# Patient Record
Sex: Female | Born: 1950
Health system: Southern US, Community
[De-identification: ages and names within clinical notes are randomized; demographics above are authoritative.]

## PROBLEM LIST (undated history)

## (undated) DIAGNOSIS — I671 Cerebral aneurysm, nonruptured: Secondary | ICD-10-CM

## (undated) HISTORY — PX: CEREBRAL ANEURYSM REPAIR: SHX164

---

## 1998-04-16 ENCOUNTER — Other Ambulatory Visit: Admission: RE | Admit: 1998-04-16 | Discharge: 1998-04-16 | Payer: Self-pay

## 1998-11-05 ENCOUNTER — Other Ambulatory Visit: Admission: RE | Admit: 1998-11-05 | Discharge: 1998-11-05 | Payer: Self-pay | Admitting: Obstetrics & Gynecology

## 1999-10-05 ENCOUNTER — Encounter: Admission: RE | Admit: 1999-10-05 | Discharge: 1999-10-05 | Payer: Self-pay | Admitting: Obstetrics & Gynecology

## 1999-10-06 ENCOUNTER — Encounter: Payer: Self-pay | Admitting: Obstetrics & Gynecology

## 2000-01-07 ENCOUNTER — Other Ambulatory Visit: Admission: RE | Admit: 2000-01-07 | Discharge: 2000-01-07 | Payer: Self-pay | Admitting: Obstetrics and Gynecology

## 2000-12-26 ENCOUNTER — Encounter: Payer: Self-pay | Admitting: Obstetrics and Gynecology

## 2000-12-26 ENCOUNTER — Encounter: Admission: RE | Admit: 2000-12-26 | Discharge: 2000-12-26 | Payer: Self-pay | Admitting: Obstetrics and Gynecology

## 2001-01-05 ENCOUNTER — Other Ambulatory Visit: Admission: RE | Admit: 2001-01-05 | Discharge: 2001-01-05 | Payer: Self-pay | Admitting: Obstetrics and Gynecology

## 2002-01-16 ENCOUNTER — Other Ambulatory Visit: Admission: RE | Admit: 2002-01-16 | Discharge: 2002-01-16 | Payer: Self-pay | Admitting: Obstetrics and Gynecology

## 2002-10-29 ENCOUNTER — Encounter: Admission: RE | Admit: 2002-10-29 | Discharge: 2002-10-29 | Payer: Self-pay | Admitting: Obstetrics and Gynecology

## 2002-10-29 ENCOUNTER — Encounter: Payer: Self-pay | Admitting: Obstetrics and Gynecology

## 2004-06-09 ENCOUNTER — Other Ambulatory Visit: Admission: RE | Admit: 2004-06-09 | Discharge: 2004-06-09 | Payer: Self-pay | Admitting: Obstetrics and Gynecology

## 2008-11-06 ENCOUNTER — Encounter: Admission: RE | Admit: 2008-11-06 | Discharge: 2008-11-06 | Payer: Self-pay | Admitting: Obstetrics and Gynecology

## 2009-07-19 HISTORY — PX: FOOT SURGERY: SHX648

## 2010-01-01 ENCOUNTER — Encounter: Admission: RE | Admit: 2010-01-01 | Discharge: 2010-01-01 | Payer: Self-pay | Admitting: Obstetrics and Gynecology

## 2010-01-07 ENCOUNTER — Encounter: Admission: RE | Admit: 2010-01-07 | Discharge: 2010-01-07 | Payer: Self-pay | Admitting: Obstetrics and Gynecology

## 2010-06-23 ENCOUNTER — Encounter: Admission: RE | Admit: 2010-06-23 | Discharge: 2010-06-23 | Payer: Self-pay | Admitting: Obstetrics and Gynecology

## 2010-07-23 LAB — BASIC METABOLIC PANEL
BUN: 13 mg/dL (ref 6–23)
CO2: 30 mEq/L (ref 19–32)
Calcium: 9.3 mg/dL (ref 8.4–10.5)
Chloride: 107 mEq/L (ref 96–112)
Creatinine, Ser: 0.86 mg/dL (ref 0.4–1.2)
GFR calc Af Amer: >60 mL/min (ref >60)
GFR calc non Af Amer: >60 mL/min (ref >60)
Glucose, Bld: 88 mg/dL (ref 70–99)
Potassium: 4.7 mEq/L (ref 3.5–5.1)
Sodium: 143 mEq/L (ref 135–145)

## 2010-07-23 LAB — DIFFERENTIAL
Basophils Absolute: 0 10*3/uL (ref 0.0–0.1)
Basophils Relative: 0 % (ref 0–1)
Eosinophils Absolute: 0.1 10*3/uL (ref 0.0–0.7)
Eosinophils Relative: 2 % (ref 0–5)
Lymphocytes Relative: 45 % (ref 12–46)
Lymphs Abs: 3.1 10*3/uL (ref 0.7–4.0)
Monocytes Absolute: 0.5 10*3/uL (ref 0.1–1.0)
Monocytes Relative: 7 % (ref 3–12)
Neutro Abs: 3.2 10*3/uL (ref 1.7–7.7)
Neutrophils Relative %: 46 % (ref 43–77)

## 2010-07-23 LAB — URINALYSIS, ROUTINE W REFLEX MICROSCOPIC
Bilirubin Urine: NEGATIVE
Hemoglobin, Urine: NEGATIVE
Ketones, ur: NEGATIVE mg/dL
Nitrite: NEGATIVE
Protein, ur: NEGATIVE mg/dL
Specific Gravity, Urine: 1.015 (ref 1.005–1.030)
Urine Glucose, Fasting: NEGATIVE mg/dL
Urobilinogen, UA: 0.2 mg/dL (ref 0.0–1.0)
pH: 8 (ref 5.0–8.0)

## 2010-07-23 LAB — CBC
HCT: 35.9 % — ABNORMAL LOW (ref 36.0–46.0)
Hemoglobin: 11.9 g/dL — ABNORMAL LOW (ref 12.0–15.0)
MCH: 30.7 pg (ref 26.0–34.0)
MCHC: 33.1 g/dL (ref 30.0–36.0)
MCV: 92.8 fL (ref 78.0–100.0)
Platelets: 270 10*3/uL (ref 150–400)
RBC: 3.87 MIL/uL (ref 3.87–5.11)
RDW: 13.6 % (ref 11.5–15.5)
WBC: 6.9 10*3/uL (ref 4.0–10.5)

## 2010-07-23 LAB — APTT: aPTT: 26 seconds (ref 24–37)

## 2010-07-23 LAB — PROTIME-INR
INR: 0.95 (ref 0.00–1.49)
Prothrombin Time: 12.9 seconds (ref 11.6–15.2)

## 2010-07-24 ENCOUNTER — Ambulatory Visit (HOSPITAL_COMMUNITY)
Admission: RE | Admit: 2010-07-24 | Discharge: 2010-07-24 | Payer: Self-pay | Source: Home / Self Care | Attending: Orthopedic Surgery | Admitting: Orthopedic Surgery

## 2010-08-07 NOTE — Op Note (Signed)
  NAMEKRYSTL, Adriana Peterson              ACCOUNT NO.:  [id number]  MEDICAL RECORD NO.:  [id number]          PATIENT TYPE:  AMB  LOCATION:  SDS                          FACILITY:  [facility]  PHYSICIAN:  [name], M.D. DATE OF BIRTH:  January 20, 1951  DATE OF PROCEDURE:  07/24/2010 DATE OF DISCHARGE:                              OPERATIVE REPORT   PREOPERATIVE DIAGNOSIS:  Right plantar fasciitis, chronic.  POSTOPERATIVE DIAGNOSIS:  Right plantar fasciitis, chronic.  PROCEDURE PERFORMED:  Right plantar fascia release.  ATTENDING SURGEON:  [name], MD  ASSISTANT:  Donnie Coffin. Dixon, PA-C (necessary for appropriate positioning and visualization of the plantar fascia during surgery).  General anesthesia was used by laryngeal mask.  BLOOD LOSS:  Minimal.  TOURNIQUET TIME:  Fifteen minutes at 250 mmHg.  Instrument count was correct.  There were no complications.  Perioperative antibiotics were given.  INDICATIONS:  The patient is a 60 year old female __________.  DESCRIPTION OF OPERATION:  After adequate level of anesthesia was achieved, the patient was positioned supine on the operating room table. Nonsterile tourniquet placed on the cap.  The right leg and foot were then prepped and draped in the usual manner after exsanguination of the limb using an Esmarch bandage.  We elevated the tourniquet to 250 mmHg. A transverse skin incision was created over the plantar aspect of the foot near the heel pad.  This was done in line with the typical superficial nerves in that area.  This was done with a 10 blade scalpel. Dissection carried down through the subcutaneous tissues using a Metzenbaum scissors.  We identified the plantar fascia, used a Weitlaner retractor to expose.  I was able to identify the medial and lateral aspects of the plantar fascia just anterior to the calcaneal tuberosity and then using a fresh 15 blade scalpel, divided the medial two-thirds of the plantar  fascia, leaving the lateral third intact.  Following this, we checked for any bleeders.  We used a Bovie electrocautery to control bleeding very careful not to injure any deep structures and at this point, we were happy with our plantar fascia release, went ahead and thoroughly irrigated the wound and closed the wound with interrupted nylon suture with vertical mattress suture technique with 2-0 nylon.  Sterile compressive bandage and a short-leg splint were applied with the patient's foot neutral. The patient tolerated the procedure well.     [name], M.D.     SRN/MEDQ  D:  07/24/2010  T:  07/24/2010  Job:  [id number]  Electronically Signed by Malon Kindle  on 08/07/2010 09:41:57 AM

## 2010-08-13 LAB — SURGICAL PCR SCREEN
MRSA, PCR: NEGATIVE
Staphylococcus aureus: NEGATIVE

## 2010-12-08 ENCOUNTER — Other Ambulatory Visit: Payer: Self-pay | Admitting: Obstetrics and Gynecology

## 2010-12-08 DIAGNOSIS — N63 Unspecified lump in unspecified breast: Secondary | ICD-10-CM

## 2011-01-04 ENCOUNTER — Ambulatory Visit
Admission: RE | Admit: 2011-01-04 | Discharge: 2011-01-04 | Disposition: A | Discharge status: Home / Self Care | Payer: BC Managed Care – PPO | Source: Ambulatory Visit | Attending: Obstetrics and Gynecology | Admitting: Obstetrics and Gynecology

## 2011-01-04 DIAGNOSIS — N63 Unspecified lump in unspecified breast: Secondary | ICD-10-CM

## 2011-03-04 ENCOUNTER — Telehealth (INDEPENDENT_AMBULATORY_CARE_PROVIDER_SITE_OTHER): Payer: Self-pay | Admitting: General Surgery

## 2011-03-11 ENCOUNTER — Telehealth (INDEPENDENT_AMBULATORY_CARE_PROVIDER_SITE_OTHER): Payer: Self-pay

## 2011-03-26 NOTE — Telephone Encounter (Signed)
Spoke with pt and will call with her surgery date.

## 2011-05-14 ENCOUNTER — Other Ambulatory Visit (INDEPENDENT_AMBULATORY_CARE_PROVIDER_SITE_OTHER): Payer: Self-pay | Admitting: General Surgery

## 2011-05-14 ENCOUNTER — Encounter (HOSPITAL_COMMUNITY): Payer: BC Managed Care – PPO

## 2011-05-14 LAB — CBC
HCT: 36.4 % (ref 36.0–46.0)
Hemoglobin: 11.9 g/dL — ABNORMAL LOW (ref 12.0–15.0)
MCH: 30 pg (ref 26.0–34.0)
MCHC: 32.7 g/dL (ref 30.0–36.0)
MCV: 91.7 fL (ref 78.0–100.0)
Platelets: 273 10*3/uL (ref 150–400)
RBC: 3.97 MIL/uL (ref 3.87–5.11)
RDW: 13.8 % (ref 11.5–15.5)
WBC: 7.7 10*3/uL (ref 4.0–10.5)

## 2011-05-14 LAB — SURGICAL PCR SCREEN
MRSA, PCR: NEGATIVE
Staphylococcus aureus: NEGATIVE

## 2011-05-18 ENCOUNTER — Observation Stay (HOSPITAL_COMMUNITY)
Admission: RE | Admit: 2011-05-18 | Discharge: 2011-05-19 | Disposition: A | Discharge status: Home / Self Care | Payer: BC Managed Care – PPO | Source: Ambulatory Visit | Attending: General Surgery | Admitting: General Surgery

## 2011-05-18 ENCOUNTER — Other Ambulatory Visit (INDEPENDENT_AMBULATORY_CARE_PROVIDER_SITE_OTHER): Payer: Self-pay | Admitting: General Surgery

## 2011-05-18 DIAGNOSIS — K648 Other hemorrhoids: Principal | ICD-10-CM | POA: Insufficient documentation

## 2011-05-18 DIAGNOSIS — Z01812 Encounter for preprocedural laboratory examination: Secondary | ICD-10-CM | POA: Insufficient documentation

## 2011-05-18 DIAGNOSIS — K649 Unspecified hemorrhoids: Secondary | ICD-10-CM

## 2011-05-18 HISTORY — PX: HEMORRHOID SURGERY: SHX153

## 2011-05-20 ENCOUNTER — Telehealth (INDEPENDENT_AMBULATORY_CARE_PROVIDER_SITE_OTHER): Payer: Self-pay | Admitting: General Surgery

## 2011-05-20 NOTE — Telephone Encounter (Signed)
Post op appointment scheduled.

## 2011-05-25 NOTE — Op Note (Signed)
NAMEKALANIE, FEWELL NO.:  [id number]  MEDICAL RECORD NO.:  [id number]  LOCATION:  DAYL                         FACILITY:  [facility]  PHYSICIAN:  Sharlet Salina T. Kacelyn Rowzee, M.D.DATE OF BIRTH:  11-06-1950  DATE OF PROCEDURE:  05/18/2011 DATE OF DISCHARGE:                              OPERATIVE REPORT   PREOPERATIVE DIAGNOSIS:  Internal hemorrhoids with bleeding and prolapse.  POSTOPERATIVE DIAGNOSIS:  Internal hemorrhoids with bleeding and prolapse.  SURGICAL PROCEDURE:  PPH.  SURGEON:  Dr. Sharlet Salina T. Katrina Daddona.  ANESTHESIA:  General.  BRIEF HISTORY:  Ms. Stumpp is a 60 year old female with a long history of progressively worsening bleeding, post bowel movements with some prolapse of tissue as well.  Exam in the office anoscopy and colonoscopy are significant for a good-sized internal hemorrhoids that are friable. We discussed the options for treatments and I have elected to proceed with PPH.  We discussed the nature of the procedure, its indications, risks of general anesthesia, bleeding, infection, chronic pain, recurrent symptoms, and rare risk of rectovaginal fistula.  She understands and agrees.  She is now brought to the operating room for this procedure.  DESCRIPTION OF OPERATION:  The patient was brought to the operating room and a general endotracheal anesthesia was induced in the stretcher.  She was carefully positioned in prone jackknife position with buttocks taped apart.  She received preoperative antibiotics and has undergone a rectal prep at home.  The perineum and rectum were sterilely prepped and draped.  The rectum was then gently dilated to 3 fingers.  10 cc of Marcaine with 1 cc of Wydase was injected into the hemorrhoidal groups prior to dilatation.  The large bullet rectal retractor was placed. Exam was significant for a good-sized internal hemorrhoids without any significant external hemorrhoids.  A 2-0 Prolene pursestring suture  was then placed circumferentially incorporating mucosa and submucosa and carefully measuring 5 cm up from the dentate line.  Bullet retractor was then removed and the pursestring suture was felt to be intact and symmetrical.  The suture was passed up through the [facility] retractor which was then positioned and held in place.  The anvil of the PPH stapler was passed above the pursestring suture, and then the pursestring suture securely tied around the post.  The suture ends were brought up through the stapler and tied.  The stapler was then advanced into the rectum while closing it and it was advanced 5 cm above the dentate line.  It was closed tightly and held in place for 60 seconds and then fired and removed easily.  Prior to firing the stapler, I carefully palpated it through the vagina and there was no evidence of any impingement upon the vagina.  The staple line was intact without bleeding and above the dentate line  by several cm.  There was one enough area of minimal oozing and I placed a 3-0 Vicryl suture here.  The perirectal area was then infiltrated with 20 cc of Exparel.  A Gelfoam pack was placed.  Specimen was examined and contained a nice approximately 2 cm cuff of mucosa and submucosa, but no muscularis.  Sponge and needle counts correct.  The patient was taken to  recovery room in good condition.     Lorne Skeens. Jerral Mccauley, M.D.     Tory Emerald  D:  05/18/2011  T:  05/18/2011  Job:  [id number]  Electronically Signed by Glenna Fellows M.D. on 05/25/2011 12:09:28 PM

## 2011-06-03 ENCOUNTER — Ambulatory Visit (INDEPENDENT_AMBULATORY_CARE_PROVIDER_SITE_OTHER): Payer: BC Managed Care – PPO | Admitting: General Surgery

## 2011-06-03 ENCOUNTER — Encounter (INDEPENDENT_AMBULATORY_CARE_PROVIDER_SITE_OTHER): Payer: Self-pay | Admitting: General Surgery

## 2011-06-03 VITALS — BP 106/76 | HR 68 | Temp 98.0°F | Resp 16 | Ht 64.0 in | Wt 146.1 lb

## 2011-06-03 DIAGNOSIS — Z09 Encounter for follow-up examination after completed treatment for conditions other than malignant neoplasm: Secondary | ICD-10-CM

## 2011-06-03 NOTE — Progress Notes (Signed)
Patient returns to the office following PPH for prolapse and bleeding hemorrhoids. She is extremely pleased how she is doing. She had very minimal discomfort following surgery which has essentially resolved. She has not had any bleeding since surgery compared to daily bleeding preoperatively.  On examination external rectal exam is negative. On digital exam I can palpate the staple line which is intact and widely patent and minimally tender.  Assessment and plan: Doing well following PPH with relief of her preoperative symptoms. She is discharged to return as needed.

## 2012-01-26 ENCOUNTER — Other Ambulatory Visit: Payer: Self-pay | Admitting: Obstetrics and Gynecology

## 2012-01-26 DIAGNOSIS — Z1231 Encounter for screening mammogram for malignant neoplasm of breast: Secondary | ICD-10-CM

## 2012-02-22 ENCOUNTER — Ambulatory Visit
Admission: RE | Admit: 2012-02-22 | Discharge: 2012-02-22 | Disposition: A | Discharge status: Home / Self Care | Payer: BC Managed Care – PPO | Source: Ambulatory Visit | Attending: Obstetrics and Gynecology | Admitting: Obstetrics and Gynecology

## 2012-02-22 DIAGNOSIS — Z1231 Encounter for screening mammogram for malignant neoplasm of breast: Secondary | ICD-10-CM

## 2013-03-18 ENCOUNTER — Observation Stay (HOSPITAL_COMMUNITY)
Admission: EM | Admit: 2013-03-18 | Discharge: 2013-03-20 | Disposition: A | Discharge status: Home / Self Care | Payer: BC Managed Care – PPO | Attending: Internal Medicine | Admitting: Internal Medicine

## 2013-03-18 ENCOUNTER — Emergency Department (HOSPITAL_COMMUNITY): Payer: BC Managed Care – PPO

## 2013-03-18 ENCOUNTER — Encounter (HOSPITAL_COMMUNITY): Payer: Self-pay | Admitting: Emergency Medicine

## 2013-03-18 ENCOUNTER — Observation Stay (HOSPITAL_COMMUNITY): Payer: BC Managed Care – PPO

## 2013-03-18 DIAGNOSIS — F17210 Nicotine dependence, cigarettes, uncomplicated: Secondary | ICD-10-CM

## 2013-03-18 DIAGNOSIS — I671 Cerebral aneurysm, nonruptured: Secondary | ICD-10-CM

## 2013-03-18 DIAGNOSIS — R209 Unspecified disturbances of skin sensation: Secondary | ICD-10-CM

## 2013-03-18 DIAGNOSIS — F17200 Nicotine dependence, unspecified, uncomplicated: Secondary | ICD-10-CM | POA: Insufficient documentation

## 2013-03-18 DIAGNOSIS — E039 Hypothyroidism, unspecified: Secondary | ICD-10-CM | POA: Insufficient documentation

## 2013-03-18 DIAGNOSIS — R29810 Facial weakness: Secondary | ICD-10-CM

## 2013-03-18 DIAGNOSIS — G510 Bell's palsy: Principal | ICD-10-CM | POA: Insufficient documentation

## 2013-03-18 DIAGNOSIS — H538 Other visual disturbances: Secondary | ICD-10-CM | POA: Insufficient documentation

## 2013-03-18 DIAGNOSIS — R202 Paresthesia of skin: Secondary | ICD-10-CM

## 2013-03-18 HISTORY — DX: Cerebral aneurysm, nonruptured: I67.1

## 2013-03-18 LAB — CBC WITH DIFFERENTIAL/PLATELET
Basophils Absolute: 0 10*3/uL (ref 0.0–0.1)
Basophils Relative: 1 % (ref 0–1)
Eosinophils Absolute: 0.2 10*3/uL (ref 0.0–0.7)
Eosinophils Relative: 3 % (ref 0–5)
HCT: 38.3 % (ref 36.0–46.0)
Hemoglobin: 12.9 g/dL (ref 12.0–15.0)
Lymphocytes Relative: 37 % (ref 12–46)
Lymphs Abs: 2.6 10*3/uL (ref 0.7–4.0)
MCH: 30.6 pg (ref 26.0–34.0)
MCHC: 33.7 g/dL (ref 30.0–36.0)
MCV: 91 fL (ref 78.0–100.0)
Monocytes Absolute: 0.4 10*3/uL (ref 0.1–1.0)
Monocytes Relative: 6 % (ref 3–12)
Neutro Abs: 3.8 10*3/uL (ref 1.7–7.7)
Neutrophils Relative %: 54 % (ref 43–77)
Platelets: 302 10*3/uL (ref 150–400)
RBC: 4.21 MIL/uL (ref 3.87–5.11)
RDW: 14 % (ref 11.5–15.5)
WBC: 7.1 10*3/uL (ref 4.0–10.5)

## 2013-03-18 LAB — COMPREHENSIVE METABOLIC PANEL
ALT: 9 U/L (ref 0–35)
AST: 13 U/L (ref 0–37)
Albumin: 3.8 g/dL (ref 3.5–5.2)
Alkaline Phosphatase: 84 U/L (ref 39–117)
BUN: 13 mg/dL (ref 6–23)
CO2: 29 mEq/L (ref 19–32)
Calcium: 9.2 mg/dL (ref 8.4–10.5)
Chloride: 104 mEq/L (ref 96–112)
Creatinine, Ser: 0.88 mg/dL (ref 0.50–1.10)
GFR calc Af Amer: 79 mL/min — ABNORMAL LOW (ref >90)
GFR calc non Af Amer: 68 mL/min — ABNORMAL LOW (ref >90)
Glucose, Bld: 89 mg/dL (ref 70–99)
Potassium: 4.1 mEq/L (ref 3.5–5.1)
Sodium: 140 mEq/L (ref 135–145)
Total Bilirubin: 0.2 mg/dL — ABNORMAL LOW (ref 0.3–1.2)
Total Protein: 6.7 g/dL (ref 6.0–8.3)

## 2013-03-18 MED ORDER — ASPIRIN EC 325 MG PO TBEC
325.0000 mg | DELAYED_RELEASE_TABLET | Freq: Every day | ORAL | Status: DC
  Administered 2013-03-18 – 2013-03-20 (×3): 325 mg via ORAL
  Filled 2013-03-18 (×3): qty 1

## 2013-03-18 MED ORDER — ACETAMINOPHEN 650 MG RE SUPP: 650.0000 mg | RECTAL | Status: DC | PRN

## 2013-03-18 MED ORDER — ENOXAPARIN SODIUM 40 MG/0.4ML ~~LOC~~ SOLN
40.0000 mg | SUBCUTANEOUS | Status: DC
  Administered 2013-03-18 – 2013-03-19 (×2): 40 mg via SUBCUTANEOUS
  Filled 2013-03-18 (×3): qty 0.4

## 2013-03-18 MED ORDER — SENNOSIDES-DOCUSATE SODIUM 8.6-50 MG PO TABS
1.0000 | ORAL_TABLET | Freq: Every evening | ORAL | Status: DC | PRN
Start: 2013-03-18 — End: 2013-03-20
  Filled 2013-03-18: qty 1

## 2013-03-18 MED ORDER — POLYVINYL ALCOHOL 1.4 % OP SOLN
1.0000 [drp] | OPHTHALMIC | Status: DC | PRN
  Filled 2013-03-18: qty 15

## 2013-03-18 MED ORDER — ACETAMINOPHEN 325 MG PO TABS: 650.0000 mg | ORAL_TABLET | ORAL | Status: DC | PRN

## 2013-03-18 MED ORDER — HYDROMORPHONE HCL PF 1 MG/ML IJ SOLN: 1.0000 mg | INTRAMUSCULAR | Status: DC | PRN

## 2013-03-18 MED ORDER — ONDANSETRON HCL 4 MG/2ML IJ SOLN: 4.0000 mg | Freq: Three times a day (TID) | INTRAMUSCULAR | Status: AC | PRN

## 2013-03-18 NOTE — ED Notes (Addendum)
Pt reports having double and blurred vision for two days. Pt has a history of a brain aneurism, which a clip was placed behind her optic nerve. Pt also reports numbness around her mouth. Pt states it "feels like I have had lidocaine placed around my mouth". Pt also states that she is unable to close her right eye fully and the left eye "keeps closing". Family member states that the patient is under extreme stress.

## 2013-03-18 NOTE — H&P (Addendum)
[facility] History and Physical  Adriana Peterson:[id number] DOB: 1950-09-24 DOA: 03/18/2013  Referring physician:  Mancel Bale PCP:  Levi Aland, MD   Chief Complaint:  Blurry vision, facial droop  HPI:  The patient is a 62 y.o. year-old female with history of brain aneurysm in 1997/1998 that presented with blurred vision of the right eye and which was clipped urgently by Dr. Venetia Maxon, occasional cigarette use who presents with blurry vision and facial droop.  The patient was last at their baseline health three days ago.  The patient states that she noticed some change in the taste in her mouth "like that fish I don't like" and a smooth feeling in her mouth.  Over the next two days, she developed blurry vision in both eyes, numbness around the right side of her lips, inability to close her right eye fully, and weakness of her mouth on the right.  She has had some difficulty speaking which she attributes to difficulty moving her lips, but denies confusion, numbness or weakness of arm or leg.  She has not noticed any rash on her right face or burning sensation of her right face or ear.  She presented to the ER today because of concern for possible stroke.  She took a baby aspirin this morning.  Her blurry vision has improved some this afternoon.  She has had a lot of stress recently.    In the ER, labs were normal and CT head demonstrates an aneurysm clip in the left cavernous sinus region and possible Chiari malformation, otherwise normal.  The ER physician spoke with Dr. Thad Ranger who recommended observation and further imaging which will be determined by neurology given the history of aneurysm clip in the late 1990s.    Review of Systems:  General:  Denies fevers, chills, weight loss or gain HEENT:  Denies changes to hearing, rhinorrhea, sinus congestion, sore throat CV:  Denies chest pain and palpitations, lower extremity edema.  PULM:  Denies SOB, wheezing, cough.   GI:  Denies  nausea, vomiting, constipation, diarrhea.   GU:  Denies dysuria, frequency, urgency ENDO:  Denies polyuria, polydipsia.   HEME:  Denies hematemesis, blood in stools, melena, abnormal bruising or bleeding.  LYMPH:  Denies lymphadenopathy.   MSK:  Denies arthralgias, myalgias.   DERM:  Denies skin rash or ulcer.   NEURO:  Per HPI PSYCH:  Denies anxiety and depression.    Past Medical History  Diagnosis Date  . Brain aneurysm     s/p clip by Dr. Venetia Maxon behind the right eye 1997 or 1998   Past Surgical History  Procedure Laterality Date  . Hemorrhoid surgery  October 30.2012  . Foot surgery  2011    right foot   . Cerebral aneurysm repair  15 years ago    Social History:  reports that she has been smoking Cigarettes.  She has been smoking about 0.00 packs per day. She has never used smokeless tobacco. She reports that  drinks alcohol. She reports that she does not use illicit drugs. Lives in a townhouse with her husband.  Stairs within the house.    Allergies  Allergen Reactions  . Sulfur     Swelling     Family History  Problem Relation Age of Onset  . Kidney disease Neg Hx   . Aneurysm Neg Hx   . Stroke Neg Hx   . Heart attack Neg Hx      Prior to Admission medications   Medication Sig Start  Date End Date Taking? Authorizing Provider  aspirin EC 81 MG tablet Take 81 mg by mouth once.   Yes Historical Provider, MD   Physical Exam: Filed Vitals:   03/18/13 1606  BP: 146/79  Pulse: 93  Temp: 98.8 F (37.1 C)  TempSrc: Oral  Resp: 20  SpO2: 96%     General:  Thin CF, no acute distress, obvious right facial droop  Eyes:  Right pupil 1-20mm larger in diameter than the left, wider when open.  Does not close fully when blinking.  anicteric, non-injected.  ENT:  Nares clear.  OP clear, non-erythematous without plaques or exudates.  MMM.  Neck:  Supple without TM or JVD.    Lymph:  No cervical, supraclavicular, or submandibular LAD.  Cardiovascular:  RRR, normal  S1, S2, without m/r/g.  2+ pulses, warm extremities  Respiratory:  Diminished bilateral breath sounds with a faint wheeze heard at the right mid-back.  No rales or rhonchi.  No increased WOB.  Abdomen:  NABS.  Soft, ND/NT.    Skin:  No rashes or focal lesions.  No vesicles or erythema on face, right scalp, or ear canal except two small papules on the right chin which were self-inflicted.    Musculoskeletal:  Normal bulk and tone.  No LE edema.  Psychiatric:  A & O x 4.  Appropriate affect.  Neurologic:  Right facial droop with asymmetric smile, inability to puff cheeks on the right, mild tongue deviation to the left, inability to fully close the right eye.  5/5 strength upper and lower extremities.  Sensation diminished around the right mouth.  Difficulty saying words with "f" sounds.    Labs on Admission:  Basic Metabolic Panel:  Recent Labs Lab 03/18/13 1712  NA 140  K 4.1  CL 104  CO2 29  GLUCOSE 89  BUN 13  CREATININE 0.88  CALCIUM 9.2   Liver Function Tests:  Recent Labs Lab 03/18/13 1712  AST 13  ALT 9  ALKPHOS 84  BILITOT 0.2*  PROT 6.7  ALBUMIN 3.8   No results found for this basename: LIPASE, AMYLASE,  in the last 168 hours No results found for this basename: AMMONIA,  in the last 168 hours CBC:  Recent Labs Lab 03/18/13 1712  WBC 7.1  NEUTROABS 3.8  HGB 12.9  HCT 38.3  MCV 91.0  PLT 302   Cardiac Enzymes: No results found for this basename: CKTOTAL, CKMB, CKMBINDEX, TROPONINI,  in the last 168 hours  BNP (last 3 results) No results found for this basename: PROBNP,  in the last 8760 hours CBG: No results found for this basename: GLUCAP,  in the last 168 hours  Radiological Exams on Admission: Ct Head Wo Contrast  03/18/2013   *RADIOLOGY REPORT*  Clinical Data: Severe headache  CT HEAD WITHOUT CONTRAST  Technique:  Contiguous axial images were obtained from the base of the skull through the vertex without contrast.  Comparison: None.  Findings:  Aneurysm clip in the left cavernous sinus region.  Negative for hemorrhage.  No acute infarct or mass.  Ventricle size is normal.  Possible Chiari malformation.  Cerebellar tonsils appear low-lying.  IMPRESSION: Aneurysm clip in the left cavernous sinus region.  No acute abnormality.  Question Chiari malformation.   Original Report Authenticated By: Janeece Riggers, M.D.    EKG: Independently reviewed. NSR  Assessment/Plan Active Problems:   Facial droop   Cigarette smoker   Brain aneurysm ---  Right facial droop appears similar to peripheral nerve (  Bell's) palsy particularly given gradual onset of symptoms, however, she has had some atypical features such as bilateral blurry vision, notable numbness of the right face, some difficulty speaking, and change in taste sensation which could reflect a brainstem lesion.   -  Spoke with Dr. Jordan Likes, Neurosurgery, regarding aneurysm clip who stated that they have used MRI-compatible clips [phone] and since her clip was placed in 1997 or 1998, Ms. Brierley's clip should be safe for MRI.   -  Neurology consult pending -  Telemetry -  CXR -  ASA -  Eye lubricant right eye -  Consider patch to avoid eye dryness -  Lipid panel and A1c -  Neuro checks q4h -  Further imaging and studies to be ordered by neurology at their request -  SLP consult  Cigarette use  -  Nurse to provide smoking cessation information.  Counseled cessation.    Diet:  Healthy heart Access:  PIV IVF:  OFF Proph:  lovenox  Code Status: full code Family Communication: spoke with patient and her husband Disposition Plan: Admit to telemetry  Time spent: 60 min Renae Fickle [facility] Pager [phone]  If 7PM-7AM, please contact night-coverage [email or web address] Password [facility] 03/18/2013, 7:04 PM

## 2013-03-18 NOTE — ED Provider Notes (Signed)
  Adriana Peterson is a 62 y.o. female here for evaluation of blurred vision, unable to close right eye (oral numbness and difficulty controlling her mouth for 3 days. The symptoms are progressive. She initially noted ALT a sensation 3 days ago, then numbness around her mouth yesterday, and then difficulty with vision, today.  She's never had this previously. She denies headache. She uses reading glasses.  Exam- exam alert, anxious, mildly uncomfortable. Neurologic- intact hearing mild, right facial droop with inability to fully close right eyelid ,  and altered eyebrow elevation, with depression of the right nasolabial fold . There is mild left perioral altered light-touch sensation. Normal strength in arms and legs. No ataxia or dysmetria. No aphasia or dysarthria.  Assessment: Mixed neurologic abnormality, pially consistent with facial nerve palsy . Patient has had aneurysmal clipping and is not a candidate for  MRI brain .   1801-Consultation with neurohospitalist- Dr. Thad Ranger recommends admission for observation, and consultation. She states that the patient will require further imaging of some type.  Medical screening examination/treatment/procedure(s) were conducted as a shared visit with non-physician practitioner(s) and myself.  I personally evaluated the patient during the encounter  Flint Melter, MD 03/18/13 2350

## 2013-03-18 NOTE — Progress Notes (Signed)
ANTICOAGULATION CONSULT NOTE - Initial Consult  Pharmacy Consult for Lovenox Indication: VTE prophylaxis  Allergies  Allergen Reactions  . Sulfur     Swelling     Patient Measurements: Height: 5\' 4"  (162.6 cm) Weight: 136 lb 3.9 oz (61.8 kg) IBW/kg (Calculated) : 54.7 Heparin Dosing Weight:   Vital Signs: Temp: 98.3 F (36.8 C) (08/31 1956) Temp src: Oral (08/31 1956) BP: 125/69 mmHg (08/31 1956) Pulse Rate: 74 (08/31 1956)  Labs:  Recent Labs  03/18/13 1712  HGB 12.9  HCT 38.3  PLT 302  CREATININE 0.88    Estimated Creatinine Clearance: 56.5 ml/min (by C-G formula based on Cr of 0.88).   Medical History: Past Medical History  Diagnosis Date  . Brain aneurysm     s/p clip by Dr. Venetia Maxon behind the right eye 1997 or 1998    Medications:  Scheduled:  . aspirin EC  325 mg Oral Daily  . enoxaparin (LOVENOX) injection  40 mg Subcutaneous Q24H   Infusions:   PRN: acetaminophen, acetaminophen, ondansetron (ZOFRAN) IV, polyvinyl alcohol, senna-docusate  Assessment: 62 yo F with history of brain aneurysm (97/98).  Now patient has symptoms of blurry vision,facial droop, difficulty speaking, and change in taste. Goal of Therapy:  Heparin level 03.-0.6 units/ml Monitor platelets by anticoagulation protocol: Yes   Plan:  Lovenox 40 mg SQ q 24 hours. Follow up patient's progress,labs.  [name] 03/18/2013,8:27 PM

## 2013-03-18 NOTE — ED Provider Notes (Signed)
CSN: [id number]     Arrival date & time 03/18/13  1603 History   First MD Initiated Contact with Patient 03/18/13 1629     Chief Complaint  Patient presents with  . Numbness    Mouth  . Blurred Vision   (Consider location/radiation/quality/duration/timing/severity/associated sxs/prior Treatment) HPI  Adriana Peterson is a 62 y.o. female with PMH significant for repaired cerebral aneurism c/o blurred vision, taste sensation change, and left eye blinking excessively and inability to fully close right right eye x2 days. Pt denies dysarthria, ataxia, slurred speech, numbness or weakness in the extremities.    History reviewed. No pertinent past medical history. Past Surgical History  Procedure Laterality Date  . Hemorrhoid surgery  October 30.2012  . Foot surgery  2011    right foot   . Cerebral aneurysm repair  15 years ago    No family history on file. History  Substance Use Topics  . Smoking status: Former Games developer  . Smokeless tobacco: Never Used  . Alcohol Use: No   OB History   Grav Para Term Preterm Abortions TAB SAB Ect Mult Living                 Review of Systems 10 systems reviewed and found to be negative, except as noted in the HPI  Allergies  Sulfur  Home Medications   Current Outpatient Rx  Name  Route  Sig  Dispense  Refill  . aspirin EC 81 MG tablet   Oral   Take 81 mg by mouth once.          BP 146/79  Pulse 93  Temp(Src) 98.8 F (37.1 C) (Oral)  Resp 20  SpO2 96% Physical Exam  Nursing note and vitals reviewed. Constitutional: She is oriented to person, place, and time. She appears well-developed and well-nourished. No distress.  HENT:  Head: Normocephalic.  Mouth/Throat: Oropharynx is clear and moist.  Eyes: Conjunctivae and EOM are normal.  Neck: Normal range of motion.  Cardiovascular: Normal rate, regular rhythm and intact distal pulses.   Pulmonary/Chest: Effort normal and breath sounds normal. No stridor. No respiratory distress. She  has no wheezes. She has no rales. She exhibits no tenderness.  Abdominal: Soft. There is no tenderness.  Musculoskeletal: Normal range of motion.  Neurological: She is alert and oriented to person, place, and time.  Pt has decreased nasolabial fold on the right, no blinking on the right, uvula midline. Pt cannot fully lift eyebrow on the right. Pupils are slightly mydriatic, but they are reactive and equal bilaterally. CN are otherwise normal and finger to nose, heel to shin, and gait are coordinated.   Psychiatric: She has a normal mood and affect.    ED Course  Procedures (including critical care time) Labs Review Labs Reviewed  COMPREHENSIVE METABOLIC PANEL - Abnormal; Notable for the following:    Total Bilirubin 0.2 (*)    GFR calc non Af Amer 68 (*)    GFR calc Af Amer 79 (*)    All other components within normal limits  CBC WITH DIFFERENTIAL   Imaging Review Ct Head Wo Contrast  03/18/2013   *RADIOLOGY REPORT*  Clinical Data: Severe headache  CT HEAD WITHOUT CONTRAST  Technique:  Contiguous axial images were obtained from the base of the skull through the vertex without contrast.  Comparison: None.  Findings: Aneurysm clip in the left cavernous sinus region.  Negative for hemorrhage.  No acute infarct or mass.  Ventricle size is normal.  Possible  Chiari malformation.  Cerebellar tonsils appear low-lying.  IMPRESSION: Aneurysm clip in the left cavernous sinus region.  No acute abnormality.  Question Chiari malformation.   Original Report Authenticated By: Janeece Riggers, M.D.    Date: 03/18/2013  Rate: 70  Rhythm: normal sinus rhythm  QRS Axis: normal  Intervals: normal  ST/T Wave abnormalities: normal  Conduction Disutrbances:none  Narrative Interpretation:   Old EKG Reviewed: None available  MDM   1. Facial weakness   2. Paresthesia    Filed Vitals:   03/18/13 1606  BP: 146/79  Pulse: 93  Temp: 98.8 F (37.1 C)  TempSrc: Oral  Resp: 20  SpO2: 96%     Adriana Peterson is a 62 y.o. female with symptoms c/w bell palsy however pt is also reporting a numbness around the mouth, even on the left side (palsey is affecting the right). CT is negative. Dr. Effie Shy consulted Dr. Thad Ranger who advises hospitalist admission at [facility] long. She will come to evaluate the patient later in the evening. Patient will be admitted under the care of Dr. Malachi Bonds to a telemetry bed  This is a shared visit with the attending physician who personally evaluated the patient and agrees with the care plan.   Medications  ondansetron (ZOFRAN) injection 4 mg (not administered)  HYDROmorphone (DILAUDID) injection 1 mg (not administered)    Note: Portions of this report may have been transcribed using voice recognition software. Every effort was made to ensure accuracy; however, inadvertent computerized transcription errors may be present      Wynetta Emery, PA-C 03/20/13 0051

## 2013-03-18 NOTE — ED Notes (Signed)
Far sight vision at 20Ft:  Left eye: 20/70 Right eye: 20/50 Both: 20/40

## 2013-03-19 ENCOUNTER — Observation Stay (HOSPITAL_COMMUNITY): Payer: BC Managed Care – PPO

## 2013-03-19 DIAGNOSIS — E039 Hypothyroidism, unspecified: Secondary | ICD-10-CM

## 2013-03-19 LAB — HEMOGLOBIN A1C
Hgb A1c MFr Bld: 5.8 % — ABNORMAL HIGH (ref <5.7)
Mean Plasma Glucose: 120 mg/dL — ABNORMAL HIGH (ref <117)

## 2013-03-19 LAB — TSH: TSH: 11.61 u[IU]/mL — ABNORMAL HIGH (ref 0.350–4.500)

## 2013-03-19 LAB — LIPID PANEL
Cholesterol: 204 mg/dL — ABNORMAL HIGH (ref 0–200)
HDL: 85 mg/dL (ref >39)
LDL Cholesterol: 106 mg/dL — ABNORMAL HIGH (ref 0–99)
Total CHOL/HDL Ratio: 2.4 RATIO
Triglycerides: 66 mg/dL (ref <150)
VLDL: 13 mg/dL (ref 0–40)

## 2013-03-19 LAB — VITAMIN B12: Vitamin B-12: 488 pg/mL (ref 211–911)

## 2013-03-19 MED ORDER — LEVOTHYROXINE SODIUM 25 MCG PO TABS
25.0000 ug | ORAL_TABLET | Freq: Every day | ORAL | Status: DC
  Administered 2013-03-19 – 2013-03-20 (×2): 25 ug via ORAL
  Filled 2013-03-19 (×3): qty 1

## 2013-03-19 MED ORDER — NAPHAZOLINE-GLYCERIN 0.012-0.2 % OP SOLN: 1.0000 [drp] | OPHTHALMIC | Status: DC | PRN

## 2013-03-19 MED ORDER — NAPHAZOLINE-PHENIRAMINE 0.025-0.3 % OP SOLN
1.0000 [drp] | Freq: Four times a day (QID) | OPHTHALMIC | Status: DC | PRN
  Administered 2013-03-19: 2 [drp] via OPHTHALMIC
  Filled 2013-03-19: qty 15

## 2013-03-19 MED ORDER — PREDNISONE 50 MG PO TABS
60.0000 mg | ORAL_TABLET | Freq: Every day | ORAL | Status: DC
  Administered 2013-03-20: 60 mg via ORAL
  Filled 2013-03-19 (×2): qty 1

## 2013-03-19 MED ORDER — PANTOPRAZOLE SODIUM 40 MG PO TBEC
40.0000 mg | DELAYED_RELEASE_TABLET | Freq: Every day | ORAL | Status: DC
  Administered 2013-03-20: 40 mg via ORAL
  Filled 2013-03-19: qty 1

## 2013-03-19 MED ORDER — VALACYCLOVIR HCL 500 MG PO TABS
1000.0000 mg | ORAL_TABLET | Freq: Three times a day (TID) | ORAL | Status: DC
  Administered 2013-03-19 – 2013-03-20 (×2): 1000 mg via ORAL
  Filled 2013-03-19 (×5): qty 2

## 2013-03-19 MED ORDER — ONDANSETRON HCL 4 MG/2ML IJ SOLN: 4.0000 mg | Freq: Four times a day (QID) | INTRAMUSCULAR | Status: AC | PRN

## 2013-03-19 MED ORDER — LORAZEPAM 2 MG/ML IJ SOLN
0.5000 mg | Freq: Once | INTRAMUSCULAR | Status: AC
  Administered 2013-03-19: 0.5 mg via INTRAVENOUS

## 2013-03-19 MED ORDER — LORAZEPAM 2 MG/ML IJ SOLN
INTRAMUSCULAR | Status: AC
  Filled 2013-03-19: qty 1

## 2013-03-19 NOTE — Progress Notes (Signed)
Spiritual care intern, Jerelene Redden, visited with patient. Spouse was at bedside. Patient talked about wanting to go home. Lelon Mast will continue to follow the patient while she is here.

## 2013-03-19 NOTE — Progress Notes (Signed)
[facility] PROGRESS NOTE  Adriana Peterson ZOX:[id number] DOB: 1951/03/14 DOA: 03/18/2013 PCP: Levi Aland, MD  Assessment/Plan: 1-Right facial droop and inability to close right eye: most likely bell's palsy. -will start empiric treatment with valacyclovir and prednisone (she is exactly 3 days since symptoms started) -unable to performed MRI given unclear hx of what type of clip was placed in her aneurysm in the past. -will continue ASA -follow neurology recommendations (for further studies); my assessment is inclined towards bell's palsy  2-hypothyroidism: TSH 11.610. Will start tx with synthroid  3-mild elevation of her LDL: will recommend low fat diet  4-GI prophylaxis: will use protonix  5-Cigarette use: counseling provided.  DVT: lovenox   Code Status: Full Family Communication: husband at bedside Disposition Plan: home when medically stable.   Consultants:  Neurology  Procedures:  See below for x-ray report  Antibiotics:  Started on valacyclovir 9/1  HPI/Subjective: No fever; slightly better than on admission. No new focal deficit. Slight anxious with diagnosis.  Objective: Filed Vitals:   03/19/13 1304  BP: 100/60  Pulse: 69  Temp: 98.3 F (36.8 C)  Resp: 18    Intake/Output Summary (Last 24 hours) at 03/19/13 1603 Last data filed at 03/19/13 8119  Gross per 24 hour  Intake    480 ml  Output   1050 ml  Net   -570 ml   Filed Weights   03/18/13 1956  Weight: 61.8 kg (136 lb 3.9 oz)    Exam:   General:  NAD, afebrile; dysarthria improved; still mouth deviation to left and inability to close right eye  Cardiovascular: S1 and S2, no rubs or gallops; SR per telemetry  Respiratory: CTA bilaterally  Abdomen: soft, NT, ND, positive BS  Musculoskeletal: no edema, no cyanosis  Neuro: MS upper and lower extremities 5/5; unable to close right eye; no nystagmus and patient with difficulty frowning her forehead   Data  Reviewed: Basic Metabolic Panel:  Recent Labs Lab 03/18/13 1712  NA 140  K 4.1  CL 104  CO2 29  GLUCOSE 89  BUN 13  CREATININE 0.88  CALCIUM 9.2   Liver Function Tests:  Recent Labs Lab 03/18/13 1712  AST 13  ALT 9  ALKPHOS 84  BILITOT 0.2*  PROT 6.7  ALBUMIN 3.8   CBC:  Recent Labs Lab 03/18/13 1712  WBC 7.1  NEUTROABS 3.8  HGB 12.9  HCT 38.3  MCV 91.0  PLT 302   Studies: Dg Chest 2 View  03/18/2013   *RADIOLOGY REPORT*  Clinical Data: Stroke.  CHEST - 2 VIEW  Comparison: None.  Findings: Lungs are clear well-aerated.  No effusion or pneumothorax.  Normal heart size and mediastinal contours.  No acute osseous findings.  11mm density over the left upper chest, overlapping the anterior left second rib.  This may be osseous, although there is also an unexpected density posterior to the trachea in the lateral projection.  IMPRESSION:  1.  No evidence of acute cardiopulmonary disease. 2.  11 mm density overlapping the left upper lung which may be a nodule or osseous finding.  Recommend correlation with any prior outside imaging, or further evaluation with outpatient chest CT.   Original Report Authenticated By: Tiburcio Pea   Ct Head Wo Contrast  03/18/2013   *RADIOLOGY REPORT*  Clinical Data: Severe headache  CT HEAD WITHOUT CONTRAST  Technique:  Contiguous axial images were obtained from the base of the skull through the vertex without contrast.  Comparison: None.  Findings:  Aneurysm clip in the left cavernous sinus region.  Negative for hemorrhage.  No acute infarct or mass.  Ventricle size is normal.  Possible Chiari malformation.  Cerebellar tonsils appear low-lying.  IMPRESSION: Aneurysm clip in the left cavernous sinus region.  No acute abnormality.  Question Chiari malformation.   Original Report Authenticated By: Janeece Riggers, M.D.    Scheduled Meds: . aspirin EC  325 mg Oral Daily  . enoxaparin (LOVENOX) injection  40 mg Subcutaneous Q24H  . [START ON  03/20/2013] levothyroxine  25 mcg Oral QAC breakfast  . [START ON 03/20/2013] pantoprazole  40 mg Oral Daily  . [START ON 03/20/2013] predniSONE  60 mg Oral Q breakfast  . valACYclovir  1,000 mg Oral TID   Continuous Infusions:   Active Problems:   Facial droop   Cigarette smoker   Brain aneurysm    Time spent: >30 minutes    Arkin Imran  [facility] Pager [phone]. If 7PM-7AM, please contact night-coverage at [email or web address], password [email or web address] 03/19/2013, 4:03 PM  LOS: 1 day

## 2013-03-19 NOTE — Progress Notes (Signed)
SLP  Note  Patient Details Name: Adriana Peterson MRN: [id number] DOB: October 28, 1950  SLP   Order for speech, language evaluation received.  Will complete next date as ordered.   If pt is to dc prior to evaluation, outpt SLP may be ordered for testing/treatment if MD desires.  Thanks.  Donavan Burnet, MS [facility] SLP [phone]    Donavan Burnet, MS [facility] SLP [phone]

## 2013-03-20 ENCOUNTER — Observation Stay (HOSPITAL_COMMUNITY): Payer: BC Managed Care – PPO

## 2013-03-20 DIAGNOSIS — G510 Bell's palsy: Secondary | ICD-10-CM

## 2013-03-20 MED ORDER — EYE LUBRICANT OP OINT: TOPICAL_OINTMENT | OPHTHALMIC | Status: DC

## 2013-03-20 MED ORDER — NAPHAZOLINE-PHENIRAMINE 0.025-0.3 % OP SOLN: 1.0000 [drp] | Freq: Four times a day (QID) | OPHTHALMIC | Status: DC | PRN

## 2013-03-20 MED ORDER — PANTOPRAZOLE SODIUM 40 MG PO TBEC: 40.0000 mg | DELAYED_RELEASE_TABLET | Freq: Every day | ORAL | Status: DC

## 2013-03-20 MED ORDER — LEVOTHYROXINE SODIUM 50 MCG PO TABS: 25.0000 ug | ORAL_TABLET | Freq: Every day | ORAL | Status: DC

## 2013-03-20 MED ORDER — VALACYCLOVIR HCL 1 G PO TABS: 1000.0000 mg | ORAL_TABLET | Freq: Three times a day (TID) | ORAL | Status: AC

## 2013-03-20 MED ORDER — PREDNISONE 20 MG PO TABS: 60.0000 mg | ORAL_TABLET | Freq: Every day | ORAL | Status: DC

## 2013-03-20 MED ORDER — OPTICLUDE EYE PATCH REGULAR MISC: Status: DC

## 2013-03-20 NOTE — Discharge Summary (Signed)
Physician Discharge Summary  Adriana Peterson:[id number] DOB: [birth year] DOA: 03/18/2013  PCP: Sharyne Peach, MD  Admit date: 03/18/2013 Discharge date: 03/20/2013  Time spent: >30 minutes  Recommendations for Outpatient Follow-up:  TSH and free T4 to be repeated in 4 weeks; Synthroid to be adjusted as needed.  Discharge Diagnoses:  Bell's palsy  Facial droop secondary to Bell's palsy Cigarette smoker Brain aneurysm hypothyroidism   Discharge Condition: Stable and improved. Patient will arrange followup with PCP in 2 weeks and will also arrange outpatient physical therapy for further treatment of her Bell's pulses. Appointment has been arranged for followup with [facility] neurology associates on 04/03/13.  Diet recommendation: Low fat diet  Filed Weights   03/18/13 1956  Weight: 61.8 kg (136 lb 3.9 oz)    History of present illness:  62 y.o. year-old female with history of brain aneurysm in 1997/1998 that presented with blurred vision of the right eye and which was clipped urgently by Dr. Venetia Maxon, occasional cigarette use who presents with blurry vision and facial droop. The patient was last at their baseline health three days ago. The patient states that she noticed some change in the taste in her mouth "like that fish I don't like" and a smooth feeling in her mouth. Over the next two days, she developed blurry vision in both eyes, numbness around the right side of her lips, inability to close her right eye fully, and weakness of her mouth on the right. She has had some difficulty speaking which she attributes to difficulty moving her lips, but denies confusion, numbness or weakness of arm or leg. She has not noticed any rash on her right face or burning sensation of her right face or ear. She presented to the ER today because of concern for possible stroke. She took a baby aspirin this morning. Her blurry vision has improved some this afternoon. She has had a lot of stress recently.     Hospital Course:  1-Right facial droop and inability to close right eye: most likely bell's palsy. Neurology in a debridement with findings and at this moment no suggesting any neurologic studies. -will treat with valacyclovir and prednisone (she is exactly 3 days since symptoms started) for 10 days. -will continue baby ASA  -following neurology recommendations patient will follow up with Dr. Terrace Arabia on 04/03/13 at 8:30 AM to follow resolution/recovery of her symptoms.   2-hypothyroidism: TSH 11.610. Will start tx with synthroid 50 mcg (base on her weight)  3-mild elevation of her LDL: will recommend low fat diet   4-GI prophylaxis while on prednisone: will use protonix   5-Cigarette use: counseling provided for cessation.   Procedures:  See below for x-ray reports.  Consultations:  Neurology.  Discharge Exam: Filed Vitals:   03/20/13 0439  BP: 91/53  Pulse: 67  Temp: 97.7 F (36.5 C)  Resp: 16    General: In no acute distress, afebrile, cooperative to examination feeling better. No new neurologic deficit appreciated on exam. Cardiovascular: S1 and S2, no rubs, no gallops. Respiratory: Clear to auscultation bilaterally Abdomen: Soft, nontender, nondistended, positive bowel sounds Extremities: No edema, no cyanosis, no clubbing, full range of motion. Neurologic exam: Alert, awake and oriented x3, muscle strength 5 out of 5 bilaterally and symmetrically, patient is still with difficulty closing her right eye, no nystagmus appreciated, difficulty frowning her forehead and mild to moderate right facial droop.  Discharge Instructions  Discharge Orders   Future Appointments Provider Department Dept Phone   04/03/2013 9:00 AM  Levert Feinstein, MD [facility] [phone]   Future Orders Complete By Expires   Discharge instructions  As directed    Comments:     Take medications as prescribed Arrange follow up with PCP in 2 weeks Follow with neurology as  recommended       Medication List         aspirin EC 81 MG tablet  Take 81 mg by mouth once.     EYE LUBRICANT Oint  Apply at night and during the day if needed to guaranteed eye moisture     levothyroxine 50 MCG tablet  Commonly known as:  SYNTHROID, LEVOTHROID  Take 0.5 tablets (25 mcg total) by mouth daily before breakfast.     naphazoline-pheniramine 0.025-0.3 % ophthalmic solution  Commonly known as:  NAPHCON-A  Place 1-2 drops into both eyes 4 (four) times daily as needed (dry eyes).     Opticlude Eye Patch Regular Misc  Apply 1 eye patch at night to prevent severe dryness during sleep     pantoprazole 40 MG tablet  Commonly known as:  PROTONIX  Take 1 tablet (40 mg total) by mouth daily.     predniSONE 20 MG tablet  Commonly known as:  DELTASONE  Take 3 tablets (60 mg total) by mouth daily with breakfast.     valACYclovir 1000 MG tablet  Commonly known as:  VALTREX  Take 1 tablet (1,000 mg total) by mouth 3 (three) times daily.       Allergies  Allergen Reactions  . Sulfur     Swelling       The results of significant diagnostics from this hospitalization (including imaging, microbiology, ancillary and laboratory) are listed below for reference.    Significant Diagnostic Studies: Dg Chest 2 View  03/18/2013   *RADIOLOGY REPORT*  Clinical Data: Stroke.  CHEST - 2 VIEW  Comparison: None.  Findings: Lungs are clear well-aerated.  No effusion or pneumothorax.  Normal heart size and mediastinal contours.  No acute osseous findings.  11mm density over the left upper chest, overlapping the anterior left second rib.  This may be osseous, although there is also an unexpected density posterior to the trachea in the lateral projection.  IMPRESSION:  1.  No evidence of acute cardiopulmonary disease. 2.  11 mm density overlapping the left upper lung which may be a nodule or osseous finding.  Recommend correlation with any prior outside imaging, or further evaluation with  outpatient chest CT.   Original Report Authenticated By: Tiburcio Pea   Ct Head Wo Contrast  03/18/2013   *RADIOLOGY REPORT*  Clinical Data: Severe headache  CT HEAD WITHOUT CONTRAST  Technique:  Contiguous axial images were obtained from the base of the skull through the vertex without contrast.  Comparison: None.  Findings: Aneurysm clip in the left cavernous sinus region.  Negative for hemorrhage.  No acute infarct or mass.  Ventricle size is normal.  Possible Chiari malformation.  Cerebellar tonsils appear low-lying.  IMPRESSION: Aneurysm clip in the left cavernous sinus region.  No acute abnormality.  Question Chiari malformation.   Original Report Authenticated By: Janeece Riggers, M.D.   Labs: Basic Metabolic Panel:  Recent Labs Lab 03/18/13 1712  NA 140  K 4.1  CL 104  CO2 29  GLUCOSE 89  BUN 13  CREATININE 0.88  CALCIUM 9.2   Liver Function Tests:  Recent Labs Lab 03/18/13 1712  AST 13  ALT 9  ALKPHOS 84  BILITOT 0.2*  PROT 6.7  ALBUMIN 3.8   CBC:  Recent Labs Lab 03/18/13 1712  WBC 7.1  NEUTROABS 3.8  HGB 12.9  HCT 38.3  MCV 91.0  PLT 302    Signed:  Rey Fors  [facility] 03/20/2013, 11:43 AM

## 2013-03-20 NOTE — Consult Note (Signed)
NEURO HOSPITALIST CONSULT NOTE    Reason for Consult: right face weakness, inability to close right eye, taste changes.  HPI:                                                                                                                                          Adriana Peterson is an 62 y.o. female with a past medical history significant for surgical clipping brain aneurysm in 1997/98 (apparently a right ophthalmic aneurysm), admitted to the hospital due to acute onset right face weakness, face numbness, and taste abnormality. She stated that 3 days ago she started experiencing " a strange change in taste" that was subsequently follow by altered sensation in her face as well as difficulty drinking and putting a spoon in her mouth and eating. She tells me that drinking and eating was troublesome because she her lips were not working properly. However, she didn't have trouble chewing or swallowing, no increased lacrimation right eye. Adriana Peterson indicated that approximately 24 hours later she developed inability to blink and completely close her right eye. Can not whistle  No sound amplification or pain behind her right ear. No vertigo, double vision, focal weakness, slurred speech, language or visual disturbance. Denies preceding infection, fever, or skin rash. No recent surgery or head trauma. CT brain upon arrival to the hospital revealed no acute abnormality. At this moment, she has no taste changes but said that the can not close the right eye. Started on prednisone and valacyclovir.  Past Medical History  Diagnosis Date  . Brain aneurysm     s/p clip by Dr. Venetia Maxon behind the right eye 1997 or 1998    Past Surgical History  Procedure Laterality Date  . Hemorrhoid surgery  October 30.2012  . Foot surgery  2011    right foot   . Cerebral aneurysm repair  15 years ago     Family History  Problem Relation Age of Onset  . Kidney disease Neg Hx   . Aneurysm Neg Hx    . Stroke Neg Hx   . Heart attack Neg Hx     Social History:  reports that she has been smoking Cigarettes.  She has been smoking about 0.00 packs per day. She has never used smokeless tobacco. She reports that  drinks alcohol. She reports that she does not use illicit drugs.  Allergies  Allergen Reactions  . Sulfur     Swelling     MEDICATIONS:  I have reviewed the patient's current medications.   ROS:                                                                                                                                       History obtained from the patient and chart review.  General ROS: negative for - chills, fatigue, fever, night sweats, weight gain or weight loss Psychological ROS: negative for - behavioral disorder, hallucinations, memory difficulties, mood swings or suicidal ideation Ophthalmic ROS: negative for - blurry vision, double vision, eye pain or loss of vision ENT ROS: negative for - epistaxis, nasal discharge, oral lesions, sore throat, tinnitus or vertigo Allergy and Immunology ROS: negative for - hives or itchy/watery eyes Hematological and Lymphatic ROS: negative for - bleeding problems, bruising or swollen lymph nodes Endocrine ROS: negative for - galactorrhea, hair pattern changes, polydipsia/polyuria or temperature intolerance Respiratory ROS: negative for - cough, hemoptysis, shortness of breath or wheezing Cardiovascular ROS: negative for - chest pain, dyspnea on exertion, edema or irregular heartbeat Gastrointestinal ROS: negative for - abdominal pain, diarrhea, hematemesis, nausea/vomiting or stool incontinence Genito-Urinary ROS: negative for - dysuria, hematuria, incontinence or urinary frequency/urgency Musculoskeletal ROS: negative for - joint swelling or muscular weakness Neurological ROS: as noted in HPI Dermatological ROS:  negative for rash and skin lesion changes     Physical exam: pleasant female in no apparent distress. Blood pressure 91/53, pulse 67, temperature 97.7 F (36.5 C), temperature source Oral, resp. rate 16, height 5\' 4"  (1.626 m), weight 61.8 kg (136 lb 3.9 oz), SpO2 95.00%. Head: normocephalic. Neck: supple, no bruits, no JVD. Cardiac: no murmurs. Lungs: clear. Abdomen: soft, no tender, no mass. Extremities: no edema.  Neurologic Examination:                                                                                                      Mental Status: Alert, oriented, thought content appropriate.  Speech fluent without evidence of aphasia.  Able to follow 3 step commands without difficulty. Cranial Nerves: II: Discs flat bilaterally; Visual fields grossly normal, pupils equal, round, reactive to light and accommodation III,IV, VI: ptosis not present, extra-ocular motions intact bilaterally V: facial light touch sensation normal bilaterally VII: left face weakness peripheral type, particularly involving the right orbicularis oculi, orbicularis oralis, and buccinator.  VIII: hearing normal bilaterally IX,X: gag reflex present XI: bilateral shoulder shrug XII: midline tongue extension Motor: Right : Upper extremity   5/5    Left:  Upper extremity   5/5  Lower extremity   5/5     Lower extremity   5/5 Tone and bulk:normal tone throughout; no atrophy noted Sensory: Pinprick and light touch intact throughout, bilaterally Deep Tendon Reflexes:  1+ all over Plantars: Right: downgoing   Left: downgoing Cerebellar: normal finger-to-nose,  normal heel-to-shin test Gait:  No ataxia CV: pulses palpable throughout    Lab Results  Component Value Date/Time   CHOL 204* 03/19/2013  4:08 AM    Results for orders placed during the hospital encounter of 03/18/13 (from the past 48 hour(s))  CBC WITH DIFFERENTIAL     Status: None   Collection Time    03/18/13  5:12 PM      Result  Value Range   WBC 7.1  4.0 - 10.5 K/uL   RBC 4.21  3.87 - 5.11 MIL/uL   Hemoglobin 12.9  12.0 - 15.0 g/dL   HCT 45.4  09.8 - 11.9 %   MCV 91.0  78.0 - 100.0 fL   MCH 30.6  26.0 - 34.0 pg   MCHC 33.7  30.0 - 36.0 g/dL   RDW 14.7  82.9 - 56.2 %   Platelets 302  150 - 400 K/uL   Neutrophils Relative % 54  43 - 77 %   Neutro Abs 3.8  1.7 - 7.7 K/uL   Lymphocytes Relative 37  12 - 46 %   Lymphs Abs 2.6  0.7 - 4.0 K/uL   Monocytes Relative 6  3 - 12 %   Monocytes Absolute 0.4  0.1 - 1.0 K/uL   Eosinophils Relative 3  0 - 5 %   Eosinophils Absolute 0.2  0.0 - 0.7 K/uL   Basophils Relative 1  0 - 1 %   Basophils Absolute 0.0  0.0 - 0.1 K/uL  COMPREHENSIVE METABOLIC PANEL     Status: Abnormal   Collection Time    03/18/13  5:12 PM      Result Value Range   Sodium 140  135 - 145 mEq/L   Potassium 4.1  3.5 - 5.1 mEq/L   Chloride 104  96 - 112 mEq/L   CO2 29  19 - 32 mEq/L   Glucose, Bld 89  70 - 99 mg/dL   BUN 13  6 - 23 mg/dL   Creatinine, Ser 1.30  0.50 - 1.10 mg/dL   Calcium 9.2  8.4 - 86.5 mg/dL   Total Protein 6.7  6.0 - 8.3 g/dL   Albumin 3.8  3.5 - 5.2 g/dL   AST 13  0 - 37 U/L   ALT 9  0 - 35 U/L   Alkaline Phosphatase 84  39 - 117 U/L   Total Bilirubin 0.2 (*) 0.3 - 1.2 mg/dL   GFR calc non Af Amer 68 (*) >90 mL/min   GFR calc Af Amer 79 (*) >90 mL/min   Comment: (NOTE)     The eGFR has been calculated using the CKD EPI equation.     This calculation has not been validated in all clinical situations.     eGFR's persistently <90 mL/min signify possible Chronic Kidney     Disease.  HEMOGLOBIN A1C     Status: Abnormal   Collection Time    03/19/13  4:08 AM      Result Value Range   Hemoglobin A1C 5.8 (*) <5.7 %   Comment: (NOTE)  According to the ADA Clinical Practice Recommendations for 2011, when     HbA1c is used as a screening test:      >=6.5%   Diagnostic of Diabetes Mellitus                (if abnormal result is confirmed)     5.7-6.4%   Increased risk of developing Diabetes Mellitus     References:Diagnosis and Classification of Diabetes Mellitus,Diabetes     Care,2011,34(Suppl 1):S62-S69 and Standards of Medical Care in             Diabetes - 2011,Diabetes Care,2011,34 (Suppl 1):S11-S61.   Mean Plasma Glucose 120 (*) <117 mg/dL   Comment: Performed at [facility]  LIPID PANEL     Status: Abnormal   Collection Time    03/19/13  4:08 AM      Result Value Range   Cholesterol 204 (*) 0 - 200 mg/dL   Triglycerides 66  <454 mg/dL   HDL 85  >09 mg/dL   Total CHOL/HDL Ratio 2.4     VLDL 13  0 - 40 mg/dL   LDL Cholesterol 811 (*) 0 - 99 mg/dL   Comment:            Total Cholesterol/HDL:CHD Risk     Coronary Heart Disease Risk Table                         Men   Women      1/2 Average Risk   3.4   3.3      Average Risk       5.0   4.4      2 X Average Risk   9.6   7.1      3 X Average Risk  23.4   11.0                Use the calculated Patient Ratio     above and the CHD Risk Table     to determine the patient's CHD Risk.                ATP III CLASSIFICATION (LDL):      <100     mg/dL   Optimal      914-782  mg/dL   Near or Above                        Optimal      130-159  mg/dL   Borderline      956-213  mg/dL   High      >086     mg/dL   Very High     Performed at [facility]  TSH     Status: Abnormal   Collection Time    03/19/13  8:10 AM      Result Value Range   TSH 11.610 (*) 0.350 - 4.500 uIU/mL   Comment: Performed at [facility]  VITAMIN B12     Status: None   Collection Time    03/19/13  8:10 AM      Result Value Range   Vitamin B-12 488  211 - 911 pg/mL   Comment: Performed at [facility]    Dg Chest 2 View  03/18/2013   *RADIOLOGY REPORT*  Clinical Data: Stroke.  CHEST - 2 VIEW  Comparison: None.  Findings: Lungs are clear well-aerated.  No effusion or pneumothorax.  Normal heart  size and mediastinal  contours.  No acute osseous findings.  11mm density over the left upper chest, overlapping the anterior left second rib.  This may be osseous, although there is also an unexpected density posterior to the trachea in the lateral projection.  IMPRESSION:  1.  No evidence of acute cardiopulmonary disease. 2.  11 mm density overlapping the left upper lung which may be a nodule or osseous finding.  Recommend correlation with any prior outside imaging, or further evaluation with outpatient chest CT.   Original Report Authenticated By: Tiburcio Pea   Ct Head Wo Contrast  03/18/2013   *RADIOLOGY REPORT*  Clinical Data: Severe headache  CT HEAD WITHOUT CONTRAST  Technique:  Contiguous axial images were obtained from the base of the skull through the vertex without contrast.  Comparison: None.  Findings: Aneurysm clip in the left cavernous sinus region.  Negative for hemorrhage.  No acute infarct or mass.  Ventricle size is normal.  Possible Chiari malformation.  Cerebellar tonsils appear low-lying.  IMPRESSION: Aneurysm clip in the left cavernous sinus region.  No acute abnormality.  Question Chiari malformation.   Original Report Authenticated By: Janeece Riggers, M.D.     Assessment/Plan: 62 y/o with a constellation of symptoms and findings on neuro-exam consistent with a right Bell's palsy with involvement of the chorda tympani causing transient taste alteration and numbness. No need for further neuro-imaging at this moment as I can not find objective findings to suggest posterior fossa involvement. Continue prednisone and valacyclovir for 7-10 fays, artificial tears, right eye patch. She needs outpatient neurology follow up in 2-3 weeks. Please, call neurology with questions or concerns.    Wyatt Portela, MD [facility] [phone]  03/20/2013, 6:32 AM

## 2013-03-21 NOTE — ED Provider Notes (Signed)
Medical screening examination/treatment/procedure(s) were conducted as a shared visit with non-physician practitioner(s) and myself.  I personally evaluated the patient during the encounter  Flint Melter, MD 03/21/13 1357

## 2013-03-30 ENCOUNTER — Other Ambulatory Visit: Payer: Self-pay | Admitting: Family Medicine

## 2013-03-30 DIAGNOSIS — R911 Solitary pulmonary nodule: Secondary | ICD-10-CM

## 2013-04-03 ENCOUNTER — Ambulatory Visit: Payer: Self-pay | Admitting: Neurology

## 2013-04-04 ENCOUNTER — Ambulatory Visit
Admission: RE | Admit: 2013-04-04 | Discharge: 2013-04-04 | Disposition: A | Discharge status: Home / Self Care | Payer: BC Managed Care – PPO | Source: Ambulatory Visit | Attending: Family Medicine | Admitting: Family Medicine

## 2013-04-04 DIAGNOSIS — R911 Solitary pulmonary nodule: Secondary | ICD-10-CM

## 2013-04-04 MED ORDER — IOHEXOL 300 MG/ML  SOLN
75.0000 mL | Freq: Once | INTRAMUSCULAR | Status: AC | PRN
  Administered 2013-04-04: 75 mL via INTRAVENOUS

## 2013-04-05 ENCOUNTER — Ambulatory Visit: Payer: Self-pay | Admitting: Neurology

## 2013-06-29 ENCOUNTER — Other Ambulatory Visit (HOSPITAL_COMMUNITY)
Admission: RE | Admit: 2013-06-29 | Discharge: 2013-06-29 | Disposition: A | Discharge status: Home / Self Care | Payer: BC Managed Care – PPO | Source: Ambulatory Visit | Attending: Family Medicine | Admitting: Family Medicine

## 2013-06-29 ENCOUNTER — Other Ambulatory Visit: Payer: Self-pay | Admitting: Family Medicine

## 2013-06-29 DIAGNOSIS — Z1151 Encounter for screening for human papillomavirus (HPV): Secondary | ICD-10-CM | POA: Insufficient documentation

## 2013-06-29 DIAGNOSIS — Z124 Encounter for screening for malignant neoplasm of cervix: Secondary | ICD-10-CM | POA: Insufficient documentation

## 2015-04-01 ENCOUNTER — Other Ambulatory Visit: Payer: Self-pay | Admitting: Family Medicine

## 2015-04-01 DIAGNOSIS — R5381 Other malaise: Secondary | ICD-10-CM

## 2015-04-01 DIAGNOSIS — Z1231 Encounter for screening mammogram for malignant neoplasm of breast: Secondary | ICD-10-CM

## 2015-04-10 ENCOUNTER — Other Ambulatory Visit: Payer: Self-pay | Admitting: Family Medicine

## 2015-04-10 DIAGNOSIS — E2839 Other primary ovarian failure: Secondary | ICD-10-CM

## 2015-12-01 ENCOUNTER — Ambulatory Visit
Admission: RE | Admit: 2015-12-01 | Discharge: 2015-12-01 | Disposition: A | Discharge status: Home / Self Care | Payer: Medicare Other | Source: Ambulatory Visit | Attending: Family Medicine | Admitting: Family Medicine

## 2015-12-01 DIAGNOSIS — Z1231 Encounter for screening mammogram for malignant neoplasm of breast: Secondary | ICD-10-CM

## 2016-03-30 ENCOUNTER — Other Ambulatory Visit: Payer: Self-pay | Admitting: Family Medicine

## 2016-03-30 DIAGNOSIS — E2839 Other primary ovarian failure: Secondary | ICD-10-CM

## 2016-04-06 ENCOUNTER — Ambulatory Visit
Admission: RE | Admit: 2016-04-06 | Discharge: 2016-04-06 | Disposition: A | Discharge status: Home / Self Care | Payer: Medicare Other | Source: Ambulatory Visit | Attending: Family Medicine | Admitting: Family Medicine

## 2016-04-06 DIAGNOSIS — E2839 Other primary ovarian failure: Secondary | ICD-10-CM

## 2018-01-12 ENCOUNTER — Other Ambulatory Visit: Payer: Self-pay | Admitting: Physician Assistant

## 2018-01-12 DIAGNOSIS — M25511 Pain in right shoulder: Principal | ICD-10-CM

## 2018-01-12 DIAGNOSIS — G8929 Other chronic pain: Secondary | ICD-10-CM

## 2018-01-30 ENCOUNTER — Ambulatory Visit
Admission: RE | Admit: 2018-01-30 | Discharge: 2018-01-30 | Disposition: A | Discharge status: Home / Self Care | Payer: Self-pay | Source: Ambulatory Visit | Attending: Physician Assistant | Admitting: Physician Assistant

## 2018-01-30 ENCOUNTER — Ambulatory Visit
Admission: RE | Admit: 2018-01-30 | Discharge: 2018-01-30 | Disposition: A | Discharge status: Home / Self Care | Payer: Medicare Other | Source: Ambulatory Visit | Attending: Physician Assistant | Admitting: Physician Assistant

## 2018-01-30 DIAGNOSIS — M25511 Pain in right shoulder: Principal | ICD-10-CM

## 2018-01-30 DIAGNOSIS — G8929 Other chronic pain: Secondary | ICD-10-CM

## 2018-01-30 MED ORDER — IOPAMIDOL (ISOVUE-M 200) INJECTION 41%
15.0000 mL | Freq: Once | INTRAMUSCULAR | Status: AC
  Administered 2018-01-30: 15 mL via INTRA_ARTICULAR

## 2018-04-04 ENCOUNTER — Other Ambulatory Visit: Payer: Self-pay | Admitting: Family Medicine

## 2018-04-04 ENCOUNTER — Ambulatory Visit
Admission: RE | Admit: 2018-04-04 | Discharge: 2018-04-04 | Disposition: A | Discharge status: Home / Self Care | Payer: Medicare Other | Source: Ambulatory Visit | Attending: Family Medicine | Admitting: Family Medicine

## 2018-04-04 DIAGNOSIS — R7989 Other specified abnormal findings of blood chemistry: Secondary | ICD-10-CM

## 2018-04-04 DIAGNOSIS — R0789 Other chest pain: Secondary | ICD-10-CM

## 2018-04-04 MED ORDER — IOPAMIDOL (ISOVUE-370) INJECTION 76%
75.0000 mL | Freq: Once | INTRAVENOUS | Status: AC | PRN
  Administered 2018-04-04: 75 mL via INTRAVENOUS

## 2018-04-07 ENCOUNTER — Encounter: Payer: Self-pay | Admitting: Cardiology

## 2018-04-07 ENCOUNTER — Encounter: Payer: Self-pay | Admitting: *Deleted

## 2018-04-07 ENCOUNTER — Ambulatory Visit: Payer: Medicare Other | Admitting: Cardiology

## 2018-04-07 VITALS — BP 114/78 | HR 85 | Ht 64.0 in | Wt 139.4 lb

## 2018-04-07 DIAGNOSIS — Z01812 Encounter for preprocedural laboratory examination: Secondary | ICD-10-CM

## 2018-04-07 DIAGNOSIS — R079 Chest pain, unspecified: Secondary | ICD-10-CM

## 2018-04-07 DIAGNOSIS — R0602 Shortness of breath: Secondary | ICD-10-CM

## 2018-04-07 MED ORDER — METOPROLOL TARTRATE 50 MG PO TABS: 50.0000 mg | ORAL_TABLET | Freq: Once | ORAL | 0 refills | Status: AC

## 2018-04-07 MED ORDER — METOPROLOL TARTRATE 25 MG PO TABS: 25.0000 mg | ORAL_TABLET | Freq: Once | ORAL | 0 refills | Status: DC

## 2018-04-07 NOTE — Progress Notes (Signed)
Cardiology Office Note:    Date:  04/07/2018   ID:  Adriana Peterson, DOB 04/04/1951, MRN [id number]  PCP:  Sigmund Hazel, MD  Cardiologist:  No primary care provider on file.  Electrophysiologist:  None   Referring MD: Sigmund Hazel, MD    History of Present Illness:    Adriana Peterson is a 67 y.o. female here for evaluation of chest pain at the request of Dr. Sigmund Hazel.  On 04/04/2018 she was seen by Dr. Sigmund Hazel with complaints of chest tightness that seem to be in the right anterior upper chest radiating to her back.  Intermittent moderate in intensity sometimes light sometimes more severe.  Had shortness of breath as well.  Nausea x 2.  Denies any recent fevers chills cough palpitations. Then pain moved to left shoulder.   She has a past surgical history for brain aneurysm.    Her father died 74 mother died at 82 Alzheimer's non-smoker although she used to smoke quit in 2014, retired.  Troponin was drawn in the outpatient setting and was normal.  D-dimer was mildly elevated at 2.3, creatinine 0.8 hemoglobin 13  Denies any fevers chills syncope bleeding orthopnea.    Past Medical History:  Diagnosis Date  . Brain aneurysm    s/p clip by Dr. Venetia Maxon behind the right eye 1997 or 1998    Past Surgical History:  Procedure Laterality Date  . CEREBRAL ANEURYSM REPAIR  15 years ago   . FOOT SURGERY  2011   right foot   . HEMORRHOID SURGERY  October 30.2012    Current Medications: Current Meds  Medication Sig  . alendronate (FOSAMAX) 70 MG tablet alendronate 70 mg tablet  . aspirin EC 81 MG tablet Take 81 mg by mouth daily.  Marland Kitchen levothyroxine (SYNTHROID, LEVOTHROID) 75 MCG tablet Take 75 mcg by mouth daily.  . [DISCONTINUED] levothyroxine (SYNTHROID, LEVOTHROID) 50 MCG tablet Take 0.5 tablets (25 mcg total) by mouth daily before breakfast.     Allergies:   Sulfur   Social History   Socioeconomic History  . Marital status: Married    Spouse name: Not on file  . Number  of children: Not on file  . Years of education: Not on file  . Highest education level: Not on file  Occupational History  . Not on file  Social Needs  . Financial resource strain: Not on file  . Food insecurity:    Worry: Not on file    Inability: Not on file  . Transportation needs:    Medical: Not on file    Non-medical: Not on file  Tobacco Use  . Smoking status: Former Smoker    Types: Cigarettes    Last attempt to quit: 2012    Years since quitting: 7.7  . Smokeless tobacco: Never Used  Substance and Sexual Activity  . Alcohol use: Yes    Comment: occasional, social   . Drug use: No  . Sexual activity: Not on file  Lifestyle  . Physical activity:    Days per week: Not on file    Minutes per session: Not on file  . Stress: Not on file  Relationships  . Social connections:    Talks on phone: Not on file    Gets together: Not on file    Attends religious service: Not on file    Active member of club or organization: Not on file    Attends meetings of clubs or organizations: Not on file  Relationship status: Not on file  Other Topics Concern  . Not on file  Social History Narrative   Lives in a townhouse with her husband.  Stairs within the house.       Family History: The patient's family history is negative for Kidney disease, Aneurysm, Stroke, and Heart attack.  ROS:   Please see the history of present illness.     All other systems reviewed and are negative.  EKGs/Labs/Other Studies Reviewed:    The following studies were reviewed today: Prior office notes, lab work, EKG reviewed  EKG:  EKG is  ordered today.  The ekg ordered today demonstrates sinus rhythm 85 no other abnormalities personally reviewed and interpreted.  Recent Labs: No results found for requested labs within last 8760 hours.  Recent Lipid Panel    Component Value Date/Time   CHOL 204 (H) 03/19/2013 0408   TRIG 66 03/19/2013 0408   HDL 85 03/19/2013 0408   CHOLHDL 2.4 03/19/2013  0408   VLDL 13 03/19/2013 0408   LDLCALC 106 (H) 03/19/2013 0408    Physical Exam:    VS:  BP 114/78   Pulse 85   Ht 5\' 4"  (1.626 m)   Wt 139 lb 6.4 oz (63.2 kg)   SpO2 93%   BMI 23.93 kg/m     Wt Readings from Last 3 Encounters:  04/07/18 139 lb 6.4 oz (63.2 kg)  03/18/13 136 lb 3.9 oz (61.8 kg)  06/03/11 146 lb 2 oz (66.3 kg)     GEN:  Well nourished, well developed in no acute distress HEENT: Normal NECK: No JVD; No carotid bruits LYMPHATICS: No lymphadenopathy CARDIAC: RRR, no murmurs, rubs, gallops RESPIRATORY:  Clear to auscultation without rales, wheezing or rhonchi  ABDOMEN: Soft, non-tender, non-distended MUSCULOSKELETAL:  No edema; No deformity  SKIN: Warm and dry NEUROLOGIC:  Alert and oriented x 3 PSYCHIATRIC:  Normal affect   ASSESSMENT:    1. Chest pain, unspecified type   2. Shortness of breath   3. Pre-procedure lab exam    PLAN:    In order of problems listed above:  Angina/chest pain/dyspnea with prior long-standing history of tobacco use, quit in 2014. - We will order a CT scan of coronary arteries with FFR analysis.  - I will check an echocardiogram to ensure proper structure and function of her heart to exclude cardiomyopathy. - She wants to know her out-of-pocket cost for both tests.  Please assist her. - She feels as though something is wrong. -Thus far no evidence of pulmonary embolism. -If cardiac work-up is negative, consider further pulmonary evaluation.   Medication Adjustments/Labs and Tests Ordered: Current medicines are reviewed at length with the patient today.  Concerns regarding medicines are outlined above.  Orders Placed This Encounter  Procedures  . CT CORONARY MORPH W/CTA COR W/SCORE W/CA W/CM &/OR WO/CM  . CT CORONARY FRACTIONAL FLOW RESERVE DATA PREP  . CT CORONARY FRACTIONAL FLOW RESERVE FLUID ANALYSIS  . Basic metabolic panel  . EKG 12-Lead  . ECHOCARDIOGRAM COMPLETE   Meds ordered this encounter  Medications    . DISCONTD: metoprolol tartrate (LOPRESSOR) 25 MG tablet    Sig: Take 1 tablet (25 mg total) by mouth once for 1 dose. Take 1 tablet 1 hour before your CT scan    Dispense:  1 tablet    Refill:  0  . metoprolol tartrate (LOPRESSOR) 50 MG tablet    Sig: Take 1 tablet (50 mg total) by mouth once for 1 dose.  Take 1 tablet 1 hour before your CT scan    Dispense:  1 tablet    Refill:  0    Please cancel the RX sent for 25 mg tablet    Patient Instructions  Medication Instructions:  The current medical regimen is effective;  continue present plan and medications.  Labwork: Please have blood work before your CT scan. (BMP)  Testing/Procedures: Your physician has requested that you have an echocardiogram. Echocardiography is a painless test that uses sound waves to create images of your heart. It provides your doctor with information about the size and shape of your heart and how well your heart's chambers and valves are working. This procedure takes approximately one hour. There are no restrictions for this procedure.  Your physician has requested that you have Coronaryc CT. Cardiac computed tomography (CT) is a painless test that uses an x-ray machine to take clear, detailed pictures of your heart. For further information please visit [email or web address] Please follow instruction sheet as given.  Follow-Up: Follow up as needed.  Thank you for choosing [facility]!!         Signed, Donato Schultz, MD  04/07/2018 5:21 PM    [facility]

## 2018-04-07 NOTE — Patient Instructions (Signed)
Medication Instructions:  The current medical regimen is effective;  continue present plan and medications.  Labwork: Please have blood work before your CT scan. (BMP)  Testing/Procedures: Your physician has requested that you have an echocardiogram. Echocardiography is a painless test that uses sound waves to create images of your heart. It provides your doctor with information about the size and shape of your heart and how well your heart's chambers and valves are working. This procedure takes approximately one hour. There are no restrictions for this procedure.  Your physician has requested that you have Coronaryc CT. Cardiac computed tomography (CT) is a painless test that uses an x-ray machine to take clear, detailed pictures of your heart. For further information please visit [email or web address]. Please follow instruction sheet as given.  Follow-Up: Follow up as needed.  Thank you for choosing [facility]!!

## 2018-04-11 ENCOUNTER — Telehealth: Payer: Self-pay | Admitting: Cardiology

## 2018-04-11 NOTE — Telephone Encounter (Signed)
New Message   Pt c/o medication issue:  1. Name of Medication: metoprolol tartrate (LOPRESSOR) 50 MG tablet(Expired)  2. How are you currently taking this medication (dosage and times per day)? Take 1 tablet (50 mg total) by mouth once for 1 dose. Take 1 tablet 1 hour before your CT scan  3. Are you having a reaction (difficulty breathing--STAT)? no  4. What is your medication issue? Pt states that she is suppose to pick this medication up at the pharmacy but doesn't know which dosage to get because they have a 25 mg and a 50mg . Please call

## 2018-04-11 NOTE — Telephone Encounter (Signed)
Pt advised that she needs to pick up the 50mg  Lopressor not the 25mg  according to her notes at her last OV 04/07/18. Pt verbalized understanding.

## 2018-04-17 ENCOUNTER — Other Ambulatory Visit: Payer: Self-pay

## 2018-04-17 ENCOUNTER — Ambulatory Visit (HOSPITAL_COMMUNITY): Payer: Medicare Other | Attending: Cardiology

## 2018-04-17 DIAGNOSIS — Z87891 Personal history of nicotine dependence: Secondary | ICD-10-CM | POA: Diagnosis not present

## 2018-04-17 DIAGNOSIS — I429 Cardiomyopathy, unspecified: Secondary | ICD-10-CM | POA: Diagnosis present

## 2018-04-17 DIAGNOSIS — R079 Chest pain, unspecified: Secondary | ICD-10-CM | POA: Diagnosis present

## 2018-04-17 DIAGNOSIS — R0602 Shortness of breath: Secondary | ICD-10-CM

## 2018-04-17 DIAGNOSIS — I361 Nonrheumatic tricuspid (valve) insufficiency: Secondary | ICD-10-CM | POA: Diagnosis not present

## 2018-04-17 DIAGNOSIS — I671 Cerebral aneurysm, nonruptured: Secondary | ICD-10-CM | POA: Insufficient documentation

## 2018-04-25 ENCOUNTER — Other Ambulatory Visit: Payer: Self-pay | Admitting: Family Medicine

## 2018-04-25 DIAGNOSIS — M810 Age-related osteoporosis without current pathological fracture: Secondary | ICD-10-CM

## 2018-04-26 ENCOUNTER — Other Ambulatory Visit: Payer: Self-pay | Admitting: Family Medicine

## 2018-04-26 DIAGNOSIS — Z1231 Encounter for screening mammogram for malignant neoplasm of breast: Secondary | ICD-10-CM

## 2018-05-03 NOTE — Progress Notes (Signed)
Appointment scheduled for 05/23/2018 at 9:30 am.

## 2018-05-08 ENCOUNTER — Other Ambulatory Visit: Payer: Medicare Other | Admitting: *Deleted

## 2018-05-09 LAB — BASIC METABOLIC PANEL
BUN/Creatinine Ratio: 13 (ref 12–28)
BUN: 11 mg/dL (ref 8–27)
CO2: 24 mmol/L (ref 20–29)
Calcium: 8.9 mg/dL (ref 8.7–10.3)
Chloride: 106 mmol/L (ref 96–106)
Creatinine, Ser: 0.82 mg/dL (ref 0.57–1.00)
GFR calc Af Amer: 85 mL/min/{1.73_m2} (ref >59)
GFR calc non Af Amer: 74 mL/min/{1.73_m2} (ref >59)
Glucose: 73 mg/dL (ref 65–99)
Potassium: 4.5 mmol/L (ref 3.5–5.2)
Sodium: 145 mmol/L — ABNORMAL HIGH (ref 134–144)

## 2018-05-23 ENCOUNTER — Ambulatory Visit (HOSPITAL_COMMUNITY)
Admission: RE | Admit: 2018-05-23 | Discharge: 2018-05-23 | Disposition: A | Discharge status: Home / Self Care | Payer: Medicare Other | Source: Ambulatory Visit | Attending: Cardiology | Admitting: Cardiology

## 2018-05-23 ENCOUNTER — Encounter (HOSPITAL_COMMUNITY): Payer: Self-pay

## 2018-05-23 ENCOUNTER — Encounter: Payer: Medicare Other | Admitting: *Deleted

## 2018-05-23 ENCOUNTER — Ambulatory Visit (HOSPITAL_COMMUNITY): Payer: Medicare Other

## 2018-05-23 DIAGNOSIS — R079 Chest pain, unspecified: Secondary | ICD-10-CM | POA: Diagnosis not present

## 2018-05-23 DIAGNOSIS — R0602 Shortness of breath: Secondary | ICD-10-CM

## 2018-05-23 DIAGNOSIS — Z006 Encounter for examination for normal comparison and control in clinical research program: Secondary | ICD-10-CM

## 2018-05-23 MED ORDER — IOPAMIDOL (ISOVUE-370) INJECTION 76%
100.0000 mL | Freq: Once | INTRAVENOUS | Status: AC | PRN
  Administered 2018-05-23: 80 mL via INTRAVENOUS

## 2018-05-23 MED ORDER — NITROGLYCERIN 0.4 MG SL SUBL
SUBLINGUAL_TABLET | SUBLINGUAL | Status: AC
  Administered 2018-05-23: 0.8 mg
  Filled 2018-05-23: qty 2

## 2018-05-23 NOTE — Research (Signed)
Subject Name: Adriana Peterson  Subject met inclusion and exclusion criteria.  The informed consent form, study requirements and expectations were reviewed with the subject and questions and concerns were addressed prior to the signing of the consent form.  The subject verbalized understanding of the trial requirements.  The subject agreed to participate in the CADFEM trial and signed the informed consent  on 05/23/18.  The informed consent was obtained prior to performance of any protocol-specific procedures for the subject.  A copy of the signed informed consent was given to the subject and a copy was placed in the subject's medical record.   [name]

## 2018-05-26 ENCOUNTER — Telehealth: Payer: Self-pay | Admitting: *Deleted

## 2018-05-26 DIAGNOSIS — E785 Hyperlipidemia, unspecified: Secondary | ICD-10-CM

## 2018-05-26 DIAGNOSIS — Z79899 Other long term (current) drug therapy: Secondary | ICD-10-CM

## 2018-05-26 MED ORDER — ATORVASTATIN CALCIUM 40 MG PO TABS: 40.0000 mg | ORAL_TABLET | Freq: Every day | ORAL | 11 refills | Status: DC

## 2018-05-26 NOTE — Telephone Encounter (Signed)
Mild plaque in LAD and RCA with minimal calcification (calcium score is 4).  No obstructive CAD.   Reassuring results. Normal EF on ECHO.  With minimal CAD, would advise she start atorvastatin 40mg  PO QD to help with plaque stabilization. If she decides to start, recheck lipid profile and ALT in 2 months.  Consider pulmonary evaluation. OK to refer if she desires.  Donato Schultz, MD   Reviewed results with patient who states understanding.  She will start Atorvastatin 40 mg as ordered and have repeat Lipid/ALT in 2 months.  She will f/u with PCP for referral to pulmonary for further evaluation as necessary.    Order for atorvastatin sent into [facility].  Lab orders placed and appt scheduled.  Pt is aware.

## 2018-07-03 ENCOUNTER — Ambulatory Visit
Admission: RE | Admit: 2018-07-03 | Discharge: 2018-07-03 | Disposition: A | Discharge status: Home / Self Care | Payer: Medicare Other | Source: Ambulatory Visit | Attending: Family Medicine | Admitting: Family Medicine

## 2018-07-03 DIAGNOSIS — M810 Age-related osteoporosis without current pathological fracture: Secondary | ICD-10-CM

## 2018-07-03 DIAGNOSIS — Z1231 Encounter for screening mammogram for malignant neoplasm of breast: Secondary | ICD-10-CM

## 2018-07-26 ENCOUNTER — Other Ambulatory Visit: Payer: Medicare Other | Admitting: *Deleted

## 2018-07-26 DIAGNOSIS — Z79899 Other long term (current) drug therapy: Secondary | ICD-10-CM

## 2018-07-26 DIAGNOSIS — E785 Hyperlipidemia, unspecified: Secondary | ICD-10-CM

## 2018-07-26 LAB — LIPID PANEL
Chol/HDL Ratio: 1.6 ratio (ref 0.0–4.4)
Cholesterol, Total: 146 mg/dL (ref 100–199)
HDL: 90 mg/dL (ref >39)
LDL Calculated: 48 mg/dL (ref 0–99)
Triglycerides: 42 mg/dL (ref 0–149)
VLDL Cholesterol Cal: 8 mg/dL (ref 5–40)

## 2018-07-26 LAB — ALT: ALT: 14 IU/L (ref 0–32)

## 2018-07-28 ENCOUNTER — Telehealth: Payer: Self-pay | Admitting: *Deleted

## 2018-07-28 NOTE — Telephone Encounter (Signed)
-----   Message from Jake Bathe, MD sent at 07/28/2018  9:06 AM EST ----- LDL excellent 48.  Continue with atorvastatin 40 mg.  Liver function normal. Donato Schultz, MD

## 2018-07-28 NOTE — Telephone Encounter (Signed)
Patient notified of result.  Please refer to phone note from today for complete details.   Adriana RankinCarol Fiato, CMA 07/28/2018 4:48 PM. Pt advised to continue on Atorvastatin 40 mg daily. Pt then asked me how often should her cholesterol be checked. I advised pt generally this may be checked once a year, though this is up to the doctor. I advised pt per Dr. Pauline AusSkains ov note looks like she is to come back on a PRN basis. Advised she will need to make sure to f/u with her PCP for further f/u for her cholesterol. Pt thanked me for the call.

## 2019-01-14 IMAGING — MG DIGITAL SCREENING BILATERAL MAMMOGRAM WITH TOMO AND CAD
8 series · 8 of 24 positions shown · non-contrast
Comparison: Previous exam(s).

CLINICAL DATA: Screening.

EXAM:
DIGITAL SCREENING BILATERAL MAMMOGRAM WITH TOMO AND CAD

[L CC synth-2D]
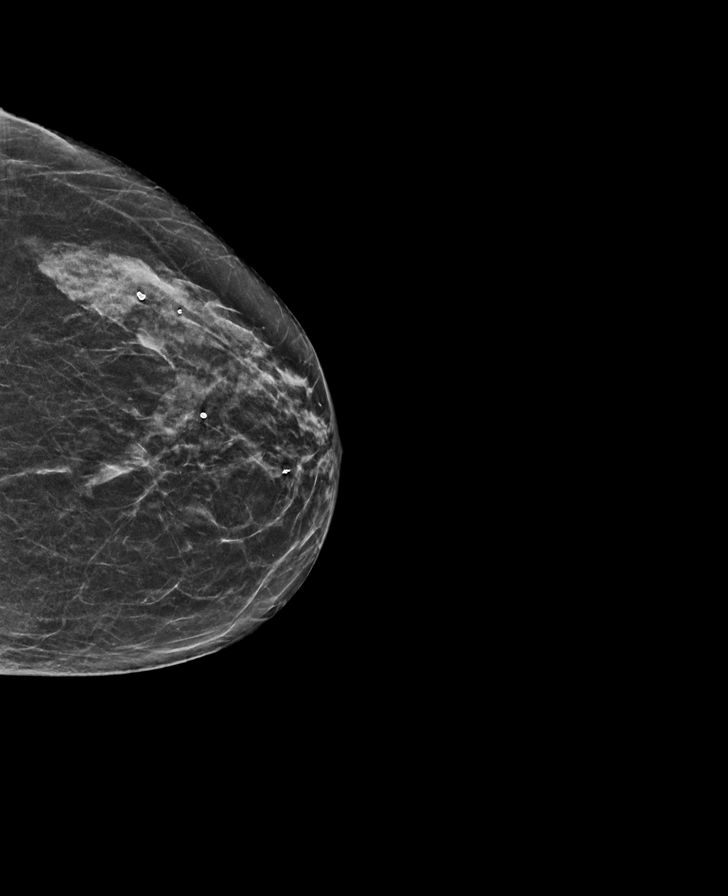

[R CC synth-2D]
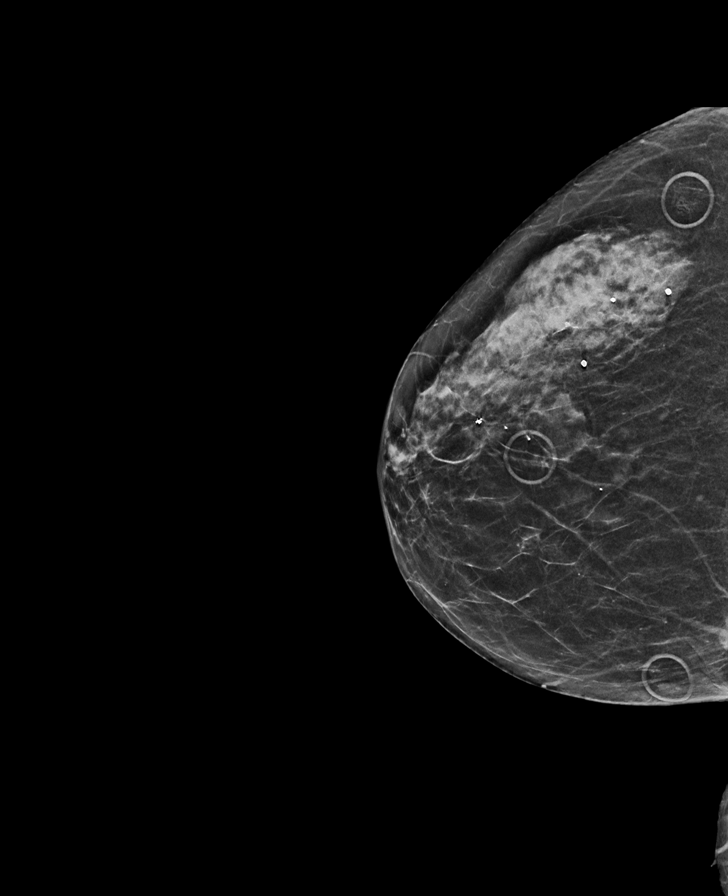

[L MLO synth-2D]
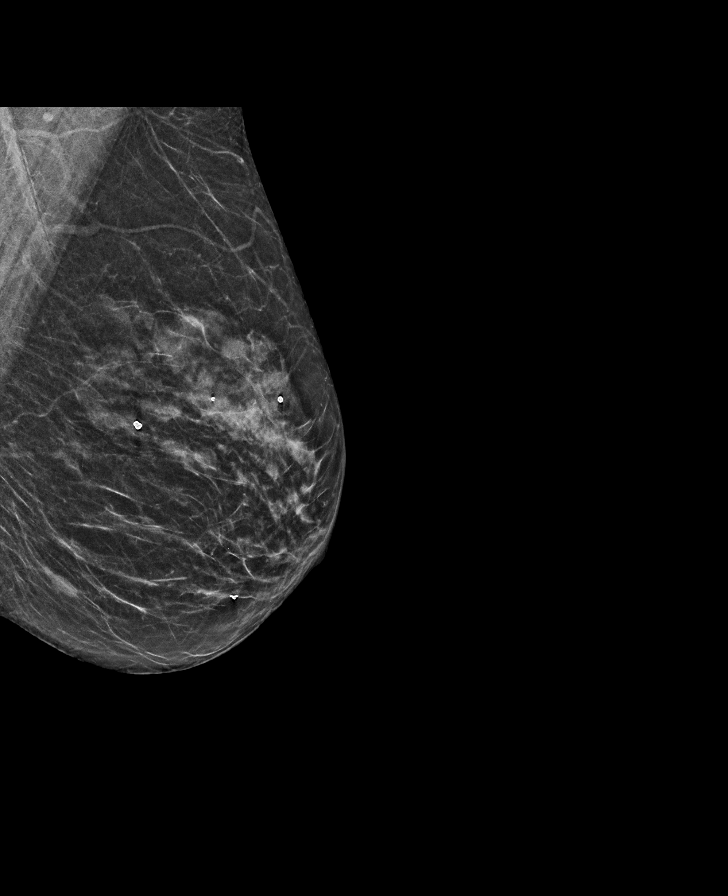

[R MLO synth-2D]
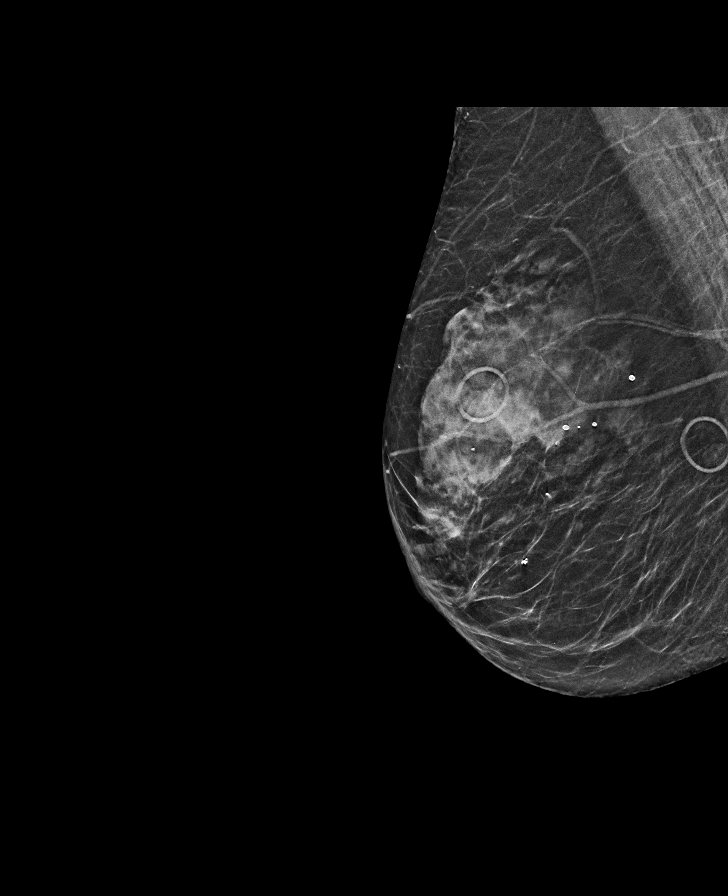

[L MLO tomo · tomo slice 27/53.0]
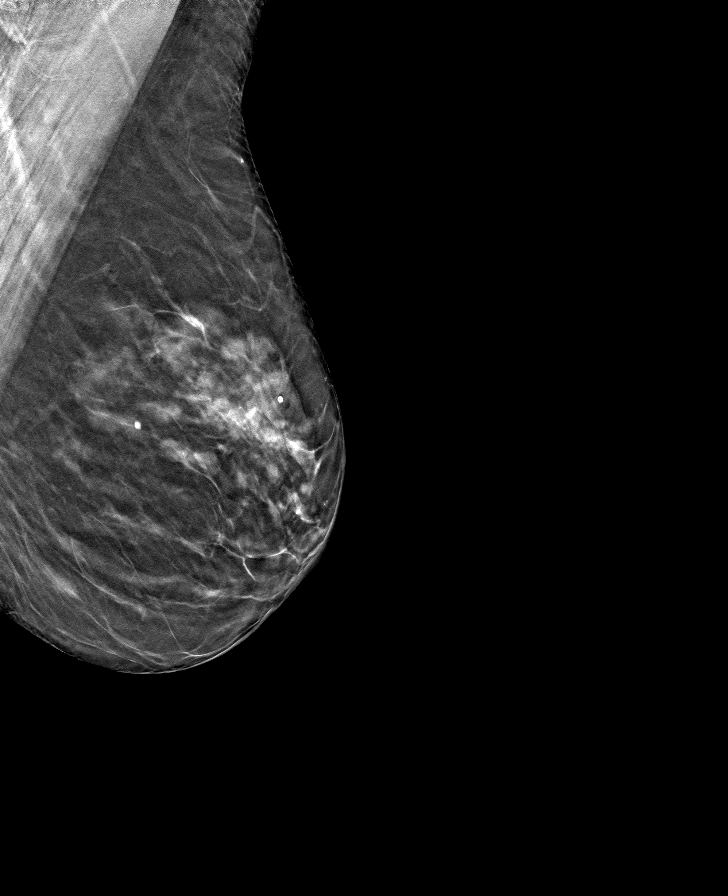

[R CC tomo · tomo slice 27/54.0]
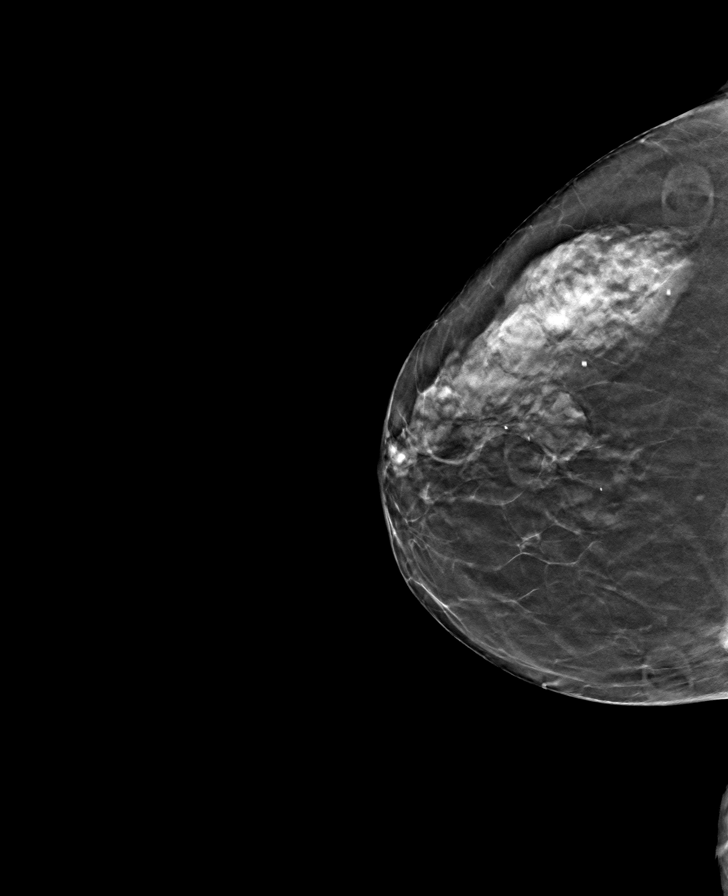

[R MLO tomo · tomo slice 27/52.0]
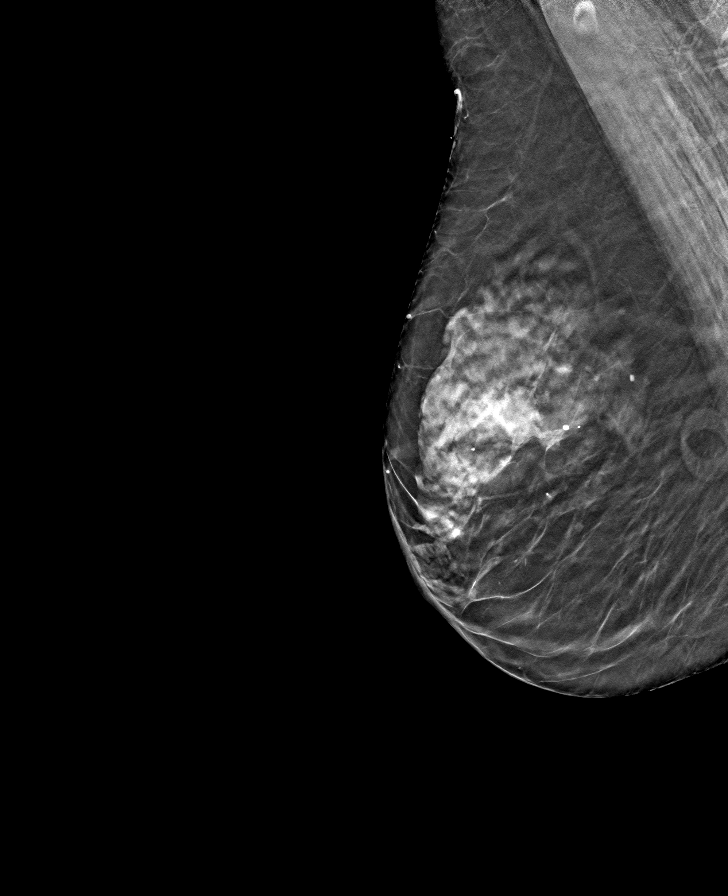

[L CC tomo · tomo slice 25/50.0]
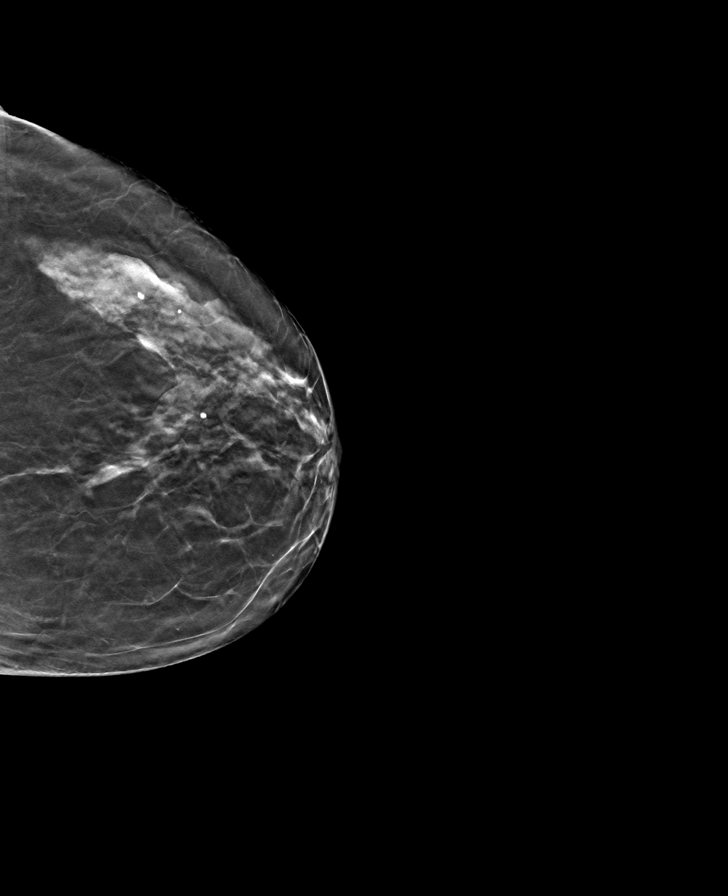

[8 of 24 positions shown; findings below may reference images not displayed]

ACR Breast Density Category c: The breast tissue is heterogeneously
dense, which may obscure small masses.
FINDINGS: There are no findings suspicious for malignancy. Images were
processed with CAD.
IMPRESSION: No mammographic evidence of malignancy. A result letter of this
screening mammogram will be mailed directly to the patient.

RECOMMENDATION:
Screening mammogram in one year. (Code:[id number])

BI-RADS CATEGORY  1: Negative.

## 2019-05-09 ENCOUNTER — Other Ambulatory Visit: Payer: Self-pay | Admitting: Cardiology

## 2019-09-16 ENCOUNTER — Ambulatory Visit: Payer: Medicare PPO | Attending: Internal Medicine

## 2019-09-16 DIAGNOSIS — Z23 Encounter for immunization: Secondary | ICD-10-CM | POA: Insufficient documentation

## 2019-09-16 NOTE — Progress Notes (Signed)
   Covid-19 Vaccination Clinic  Name:  AVERYANA PILLARS    MRN: [id number] DOB: May 21, 1951  09/16/2019  Ms. Wiater was observed post Covid-19 immunization for 15 minutes without incidence. She was provided with Vaccine Information Sheet and instruction to access the V-Safe system.   Ms. Hoon was instructed to call 911 with any severe reactions post vaccine: Marland Kitchen Difficulty breathing  . Swelling of your face and throat  . A fast heartbeat  . A bad rash all over your body  . Dizziness and weakness    Immunizations Administered    Name Date Dose VIS Date Route   Pfizer COVID-19 Vaccine 09/16/2019 11:29 AM 0.3 mL 06/29/2019 Intramuscular   Manufacturer: [employer], [employer]   Lot: [id number]   NDC: [address]-4

## 2019-10-06 ENCOUNTER — Ambulatory Visit: Payer: Medicare PPO | Attending: Internal Medicine

## 2019-10-06 DIAGNOSIS — Z23 Encounter for immunization: Secondary | ICD-10-CM

## 2019-10-06 NOTE — Progress Notes (Signed)
   Covid-19 Vaccination Clinic  Name:  Adriana Peterson    MRN: [id number] DOB: 10-Feb-1951  10/06/2019  Adriana Peterson was observed post Covid-19 immunization for 15 minutes without incident. She was provided with Vaccine Information Sheet and instruction to access the V-Safe system.   Adriana Peterson was instructed to call 911 with any severe reactions post vaccine: Marland Kitchen Difficulty breathing  . Swelling of face and throat  . A fast heartbeat  . A bad rash all over body  . Dizziness and weakness   Immunizations Administered    Name Date Dose VIS Date Route   Pfizer COVID-19 Vaccine 10/06/2019 10:06 AM 0.3 mL 06/29/2019 Intramuscular   Manufacturer: [employer], [employer]   Lot: [id number]   NDC: [address]-8

## 2020-04-14 ENCOUNTER — Other Ambulatory Visit: Payer: Self-pay | Admitting: Cardiology

## 2020-06-30 ENCOUNTER — Other Ambulatory Visit: Payer: Self-pay | Admitting: Family Medicine

## 2020-06-30 DIAGNOSIS — Z1231 Encounter for screening mammogram for malignant neoplasm of breast: Secondary | ICD-10-CM

## 2020-06-30 DIAGNOSIS — M810 Age-related osteoporosis without current pathological fracture: Secondary | ICD-10-CM

## 2020-07-23 ENCOUNTER — Telehealth: Payer: Self-pay

## 2020-07-23 NOTE — Telephone Encounter (Signed)
NOTES ON FILE FROM [facility] [phone], SENT REFERRAL TO SCHEDULING

## 2020-07-24 ENCOUNTER — Other Ambulatory Visit (HOSPITAL_COMMUNITY): Payer: Self-pay | Admitting: Respiratory Therapy

## 2020-07-24 DIAGNOSIS — R0602 Shortness of breath: Secondary | ICD-10-CM

## 2020-07-29 ENCOUNTER — Other Ambulatory Visit (HOSPITAL_COMMUNITY)
Admission: RE | Admit: 2020-07-29 | Discharge: 2020-07-29 | Disposition: A | Discharge status: Home / Self Care | Payer: Medicare PPO | Source: Ambulatory Visit | Attending: Family Medicine | Admitting: Family Medicine

## 2020-07-29 DIAGNOSIS — Z20822 Contact with and (suspected) exposure to covid-19: Secondary | ICD-10-CM | POA: Insufficient documentation

## 2020-07-29 DIAGNOSIS — Z01812 Encounter for preprocedural laboratory examination: Secondary | ICD-10-CM | POA: Insufficient documentation

## 2020-07-29 LAB — SARS CORONAVIRUS 2 (TAT 6-24 HRS): SARS Coronavirus 2: NEGATIVE

## 2020-07-30 ENCOUNTER — Ambulatory Visit (HOSPITAL_COMMUNITY)
Admission: RE | Admit: 2020-07-30 | Discharge: 2020-07-30 | Disposition: A | Discharge status: Home / Self Care | Payer: Medicare PPO | Source: Ambulatory Visit | Attending: Family Medicine | Admitting: Family Medicine

## 2020-07-30 ENCOUNTER — Other Ambulatory Visit: Payer: Self-pay

## 2020-07-30 DIAGNOSIS — R0602 Shortness of breath: Secondary | ICD-10-CM | POA: Diagnosis not present

## 2020-07-30 LAB — PULMONARY FUNCTION TEST
DL/VA % pred: 86 %
DL/VA: 3.57 ml/min/mmHg/L
DLCO unc % pred: 80 %
DLCO unc: 15.59 ml/min/mmHg
FEF 25-75 Post: 1.82 L/sec
FEF 25-75 Pre: 1.05 L/sec
FEF2575-%Change-Post: 72 %
FEF2575-%Pred-Post: 95 %
FEF2575-%Pred-Pre: 55 %
FEV1-%Change-Post: 11 %
FEV1-%Pred-Post: 87 %
FEV1-%Pred-Pre: 78 %
FEV1-Post: 1.98 L
FEV1-Pre: 1.78 L
FEV1FVC-%Change-Post: 6 %
FEV1FVC-%Pred-Pre: 93 %
FEV6-%Change-Post: 6 %
FEV6-%Pred-Post: 90 %
FEV6-%Pred-Pre: 85 %
FEV6-Post: 2.61 L
FEV6-Pre: 2.45 L
FEV6FVC-%Change-Post: 2 %
FEV6FVC-%Pred-Post: 104 %
FEV6FVC-%Pred-Pre: 102 %
FVC-%Change-Post: 4 %
FVC-%Pred-Post: 86 %
FVC-%Pred-Pre: 83 %
FVC-Post: 2.61 L
FVC-Pre: 2.5 L
Post FEV1/FVC ratio: 76 %
Post FEV6/FVC ratio: 100 %
Pre FEV1/FVC ratio: 71 %
Pre FEV6/FVC Ratio: 98 %
RV % pred: 147 %
RV: 3.22 L
TLC % pred: 116 %
TLC: 5.88 L

## 2020-07-30 MED ORDER — ALBUTEROL SULFATE (2.5 MG/3ML) 0.083% IN NEBU
2.5000 mg | INHALATION_SOLUTION | Freq: Once | RESPIRATORY_TRACT | Status: AC
  Administered 2020-07-30: 2.5 mg via RESPIRATORY_TRACT

## 2020-08-01 ENCOUNTER — Emergency Department (HOSPITAL_BASED_OUTPATIENT_CLINIC_OR_DEPARTMENT_OTHER): Payer: Medicare PPO

## 2020-08-01 ENCOUNTER — Encounter (HOSPITAL_BASED_OUTPATIENT_CLINIC_OR_DEPARTMENT_OTHER): Payer: Self-pay | Admitting: *Deleted

## 2020-08-01 ENCOUNTER — Emergency Department (HOSPITAL_BASED_OUTPATIENT_CLINIC_OR_DEPARTMENT_OTHER)
Admission: EM | Admit: 2020-08-01 | Discharge: 2020-08-01 | Disposition: A | Discharge status: Home / Self Care | Payer: Medicare PPO | Attending: Emergency Medicine | Admitting: Emergency Medicine

## 2020-08-01 ENCOUNTER — Other Ambulatory Visit: Payer: Self-pay

## 2020-08-01 DIAGNOSIS — K2900 Acute gastritis without bleeding: Secondary | ICD-10-CM | POA: Diagnosis not present

## 2020-08-01 DIAGNOSIS — Z7982 Long term (current) use of aspirin: Secondary | ICD-10-CM | POA: Insufficient documentation

## 2020-08-01 DIAGNOSIS — R1084 Generalized abdominal pain: Secondary | ICD-10-CM | POA: Diagnosis not present

## 2020-08-01 DIAGNOSIS — R109 Unspecified abdominal pain: Secondary | ICD-10-CM | POA: Diagnosis not present

## 2020-08-01 DIAGNOSIS — R0602 Shortness of breath: Secondary | ICD-10-CM | POA: Diagnosis not present

## 2020-08-01 DIAGNOSIS — R9431 Abnormal electrocardiogram [ECG] [EKG]: Secondary | ICD-10-CM | POA: Diagnosis not present

## 2020-08-01 DIAGNOSIS — Z87891 Personal history of nicotine dependence: Secondary | ICD-10-CM | POA: Insufficient documentation

## 2020-08-01 DIAGNOSIS — R1110 Vomiting, unspecified: Secondary | ICD-10-CM | POA: Diagnosis not present

## 2020-08-01 DIAGNOSIS — R1013 Epigastric pain: Secondary | ICD-10-CM | POA: Diagnosis present

## 2020-08-01 LAB — CBC WITH DIFFERENTIAL/PLATELET
Abs Immature Granulocytes: 0.02 10*3/uL (ref 0.00–0.07)
Basophils Absolute: 0.1 10*3/uL (ref 0.0–0.1)
Basophils Relative: 1 %
Eosinophils Absolute: 0.2 10*3/uL (ref 0.0–0.5)
Eosinophils Relative: 2 %
HCT: 39.1 % (ref 36.0–46.0)
Hemoglobin: 13 g/dL (ref 12.0–15.0)
Immature Granulocytes: 0 %
Lymphocytes Relative: 30 %
Lymphs Abs: 3.3 10*3/uL (ref 0.7–4.0)
MCH: 30.4 pg (ref 26.0–34.0)
MCHC: 33.2 g/dL (ref 30.0–36.0)
MCV: 91.4 fL (ref 80.0–100.0)
Monocytes Absolute: 0.8 10*3/uL (ref 0.1–1.0)
Monocytes Relative: 7 %
Neutro Abs: 6.7 10*3/uL (ref 1.7–7.7)
Neutrophils Relative %: 60 %
Platelets: 304 10*3/uL (ref 150–400)
RBC: 4.28 MIL/uL (ref 3.87–5.11)
RDW: 14.1 % (ref 11.5–15.5)
WBC: 11.2 10*3/uL — ABNORMAL HIGH (ref 4.0–10.5)
nRBC: 0 % (ref 0.0–0.2)

## 2020-08-01 LAB — URINALYSIS, ROUTINE W REFLEX MICROSCOPIC
Bilirubin Urine: NEGATIVE
Glucose, UA: NEGATIVE mg/dL
Hgb urine dipstick: NEGATIVE
Ketones, ur: NEGATIVE mg/dL
Leukocytes,Ua: NEGATIVE
Nitrite: NEGATIVE
Protein, ur: NEGATIVE mg/dL
Specific Gravity, Urine: 1.01 (ref 1.005–1.030)
pH: 6.5 (ref 5.0–8.0)

## 2020-08-01 LAB — COMPREHENSIVE METABOLIC PANEL
ALT: 17 U/L (ref 0–44)
AST: 20 U/L (ref 15–41)
Albumin: 4.5 g/dL (ref 3.5–5.0)
Alkaline Phosphatase: 64 U/L (ref 38–126)
Anion gap: 12 (ref 5–15)
BUN: 12 mg/dL (ref 8–23)
CO2: 26 mmol/L (ref 22–32)
Calcium: 9.3 mg/dL (ref 8.9–10.3)
Chloride: 103 mmol/L (ref 98–111)
Creatinine, Ser: 0.89 mg/dL (ref 0.44–1.00)
GFR, Estimated: >60 mL/min (ref >60)
Glucose, Bld: 97 mg/dL (ref 70–99)
Potassium: 3.9 mmol/L (ref 3.5–5.1)
Sodium: 141 mmol/L (ref 135–145)
Total Bilirubin: 0.5 mg/dL (ref 0.3–1.2)
Total Protein: 7.5 g/dL (ref 6.5–8.1)

## 2020-08-01 LAB — LIPASE, BLOOD: Lipase: 35 U/L (ref 11–51)

## 2020-08-01 MED ORDER — ALUM & MAG HYDROXIDE-SIMETH 200-200-20 MG/5ML PO SUSP
30.0000 mL | Freq: Once | ORAL | Status: AC
  Administered 2020-08-01: 30 mL via ORAL
  Filled 2020-08-01: qty 30

## 2020-08-01 MED ORDER — IOHEXOL 300 MG/ML  SOLN
100.0000 mL | Freq: Once | INTRAMUSCULAR | Status: AC | PRN
  Administered 2020-08-01: 100 mL via INTRAVENOUS

## 2020-08-01 MED ORDER — FAMOTIDINE 20 MG PO TABS
20.0000 mg | ORAL_TABLET | Freq: Once | ORAL | Status: AC
  Administered 2020-08-01: 20 mg via ORAL
  Filled 2020-08-01: qty 1

## 2020-08-01 NOTE — ED Provider Notes (Signed)
[facility] EMERGENCY DEPARTMENT Provider Note   CSN: [id number] Arrival date & time: 08/01/20  1721     History Chief Complaint  Patient presents with  . Abdominal Pain    Adriana Peterson is a 70 y.o. female who presents to ED with a chief complaint of abdominal pain.  For the past 3 days has been having central epigastric abdominal pain.  She had 1 episode of nonbloody, nonbilious emesis when symptoms began but none recently.  Denies any diarrhea or changes to urination.  She was seen and evaluated at PCPs office today and was sent to the ER for CT.  She states that she has been dealing with shortness of breath" having trouble completing a breath" for the past several weeks.  This has not changed.  Denies any chest pain.  No cough or hemoptysis.  No prior abdominal surgeries.  She has tried Tums, aspirin and Tylenol with only minimal improvement in her abdominal pain.  No sick contacts with similar symptoms  HPI     Past Medical History:  Diagnosis Date  . Brain aneurysm    s/p clip by Dr. Venetia Maxon behind the right eye 1997 or 1998    Patient Active Problem List   Diagnosis Date Noted  . Facial droop 03/18/2013  . Cigarette smoker 03/18/2013  . Brain aneurysm 03/18/2013    Past Surgical History:  Procedure Laterality Date  . CEREBRAL ANEURYSM REPAIR  15 years ago   . FOOT SURGERY  2011   right foot   . HEMORRHOID SURGERY  October 30.2012     OB History    Gravida  1   Para  1   Term      Preterm      AB      Living        SAB      IAB      Ectopic      Multiple      Live Births              Family History  Problem Relation Age of Onset  . Kidney disease Neg Hx   . Aneurysm Neg Hx   . Stroke Neg Hx   . Heart attack Neg Hx   . Breast cancer Neg Hx     Social History   Tobacco Use  . Smoking status: Former Smoker    Types: Cigarettes    Quit date: 2012    Years since quitting: 10.0  . Smokeless tobacco: Never Used   Substance Use Topics  . Alcohol use: Yes    Comment: occasional, social   . Drug use: No    Home Medications Prior to Admission medications   Medication Sig Start Date End Date Taking? Authorizing Provider  alendronate (FOSAMAX) 70 MG tablet alendronate 70 mg tablet    [provider]  aspirin EC 81 MG tablet Take 81 mg by mouth daily.    [provider]  atorvastatin (LIPITOR) 40 MG tablet TAKE 1 TABLET BY MOUTH EVERY DAY 04/14/20   Jake Bathe, MD  diclofenac (VOLTAREN) 75 MG EC tablet Take 75 mg by mouth 2 (two) times daily. 06/22/20   [provider]  levothyroxine (SYNTHROID, LEVOTHROID) 75 MCG tablet Take 75 mcg by mouth daily. 01/30/18   [provider]  metoprolol tartrate (LOPRESSOR) 50 MG tablet Take 1 tablet (50 mg total) by mouth once for 1 dose. Take 1 tablet 1 hour before your CT scan 04/07/18  04/07/18  Jake BatheSkains, Mark C, MD    Allergies    Sulfur  Review of Systems   Review of Systems  Constitutional: Negative for appetite change, chills and fever.  HENT: Negative for ear pain, rhinorrhea, sneezing and sore throat.   Eyes: Negative for photophobia and visual disturbance.  Respiratory: Positive for shortness of breath. Negative for cough, chest tightness and wheezing.   Cardiovascular: Negative for chest pain and palpitations.  Gastrointestinal: Positive for abdominal pain, nausea and vomiting. Negative for blood in stool, constipation and diarrhea.  Genitourinary: Negative for dysuria, hematuria and urgency.  Musculoskeletal: Negative for myalgias.  Skin: Negative for rash.  Neurological: Negative for dizziness, weakness and light-headedness.    Physical Exam Updated Vital Signs BP 118/74 (BP Location: Right Arm)   Pulse 70   Temp 98.4 F (36.9 C) (Oral)   Resp 15   Ht 5\' 4"  (1.626 m)   Wt 67.1 kg   SpO2 98%   BMI 25.40 kg/m   Physical Exam Vitals and nursing note reviewed.  Constitutional:      General: She is not in  acute distress.    Appearance: She is well-developed and well-nourished.     Comments: Speaking complete sentences without difficulty.  No signs of respiratory distress  HENT:     Head: Normocephalic and atraumatic.     Nose: Nose normal.  Eyes:     General: No scleral icterus.       Left eye: No discharge.     Extraocular Movements: EOM normal.     Conjunctiva/sclera: Conjunctivae normal.  Cardiovascular:     Rate and Rhythm: Normal rate and regular rhythm.     Pulses: Intact distal pulses.     Heart sounds: Normal heart sounds. No murmur heard. No friction rub. No gallop.   Pulmonary:     Effort: Pulmonary effort is normal. No respiratory distress.     Breath sounds: Normal breath sounds.  Abdominal:     General: Bowel sounds are normal. There is no distension.     Palpations: Abdomen is soft.     Tenderness: There is abdominal tenderness in the epigastric area and periumbilical area. There is no guarding.  Musculoskeletal:        General: No edema. Normal range of motion.     Cervical back: Normal range of motion and neck supple.     Right lower leg: No edema.     Left lower leg: No edema.  Skin:    General: Skin is warm and dry.     Findings: No rash.  Neurological:     Mental Status: She is alert.     Motor: No abnormal muscle tone.     Coordination: Coordination normal.  Psychiatric:        Mood and Affect: Mood and affect normal.     ED Results / Procedures / Treatments   Labs (all labs ordered are listed, but only abnormal results are displayed) Labs Reviewed  CBC WITH DIFFERENTIAL/PLATELET - Abnormal; Notable for the following components:      Result Value   WBC 11.2 (*)    All other components within normal limits  COMPREHENSIVE METABOLIC PANEL  LIPASE, BLOOD  URINALYSIS, ROUTINE W REFLEX MICROSCOPIC    EKG EKG Interpretation  Date/Time:  Friday August 01 2020 21:11:09 EST Ventricular Rate:  70 PR Interval:    QRS Duration: 88 QT  Interval:  395 QTC Calculation: 427 R Axis:   43 Text Interpretation: Sinus rhythm Ventricular  premature complex Baseline wander in lead(s) V2 NSR, no ischemic changes Confirmed by Coralee Pesa [phone]) on 08/01/2020 9:49:41 PM   Radiology CT ABDOMEN PELVIS W CONTRAST  Result Date: 08/01/2020 CLINICAL DATA:  Abdominal pain and vomiting. EXAM: CT ABDOMEN AND PELVIS WITH CONTRAST TECHNIQUE: Multidetector CT imaging of the abdomen and pelvis was performed using the standard protocol following bolus administration of intravenous contrast. CONTRAST:  OMNIPAQUE IOHEXOL 300 MG/ML  SOLN COMPARISON:  None. FINDINGS: Lower chest: No acute abnormality. Hepatobiliary: No focal liver abnormality is seen. No gallstones, gallbladder wall thickening, or biliary dilatation. Pancreas: Unremarkable. No pancreatic ductal dilatation or surrounding inflammatory changes. Spleen: Normal in size without focal abnormality. Adrenals/Urinary Tract: Adrenal glands are unremarkable. Kidneys are normal in size, without renal calculi or hydronephrosis. A 5 mm cystic appearing areas seen within the anterior aspect of the upper pole of the left kidney. Bladder is unremarkable. Stomach/Bowel: Stomach is within normal limits. Appendix appears normal. Surgical sutures are seen within the expected region of the rectum. No evidence of bowel wall thickening, distention, or inflammatory changes. Vascular/Lymphatic: Aortic atherosclerosis. No enlarged abdominal or pelvic lymph nodes. Reproductive: Uterus and bilateral adnexa are unremarkable. Other: No abdominal wall hernia or abnormality. No abdominopelvic ascites. Musculoskeletal: No acute or significant osseous findings. IMPRESSION: 1. No acute or active process within the abdomen or pelvis. 2. Aortic atherosclerosis. Aortic Atherosclerosis (ICD10-I70.0). Electronically Signed   By: Aram Candela M.D.   On: 08/01/2020 22:02    Procedures Procedures (including critical care  time)  Medications Ordered in ED Medications  iohexol (OMNIPAQUE) 300 MG/ML solution 100 mL (100 mLs Intravenous Contrast Given 08/01/20 2142)  famotidine (PEPCID) tablet 20 mg (20 mg Oral Given 08/01/20 2234)  alum & mag hydroxide-simeth (MAALOX/MYLANTA) 200-200-20 MG/5ML suspension 30 mL (30 mLs Oral Given 08/01/20 2234)    ED Course  I have reviewed the triage vital signs and the nursing notes.  Pertinent labs & imaging results that were available during my care of the patient were reviewed by me and considered in my medical decision making (see chart for details).    MDM Rules/Calculators/A&P                          70 year old female presenting to the ED with a chief complaint of abdominal pain.  3-day history of epigastric abdominal pain with only 1 episode of nonbloody, nonbilious emesis on symptom onset.  No changes to urination or bowel movements.  Sent to the ER from PCPs office today for CT scan.  Reports chronic shortness of breath for the past several weeks but this is unchanged.  No chest pain.  No abdominal surgeries.  Minimal improvement noted with Tylenol, aspirin and Tums.  She is concerned that this could be related to her Fosamax that she is taking.  She has not seen GI for this issue.  On exam abdomen is tender in the epigastric region without rebound or guarding.  Vital signs are within normal limits.  No signs of respiratory distress and lungs are clear.  Lab work here including CMP, lipase unremarkable.  Urinalysis without signs of infection.  Slight leukocytosis of 11.2 which I feel is reactive from her symptoms.  CT scan here without any acute findings and no evidence of explanation of her symptoms.  No evidence of perforation or other significant abnormality.  Suspect symptoms could be due to gastritis.  This could very well be due to her medications but informed her  that she should continue this until she follows up with her PCP and GI doctor which we have referred her to.   EKG without any ischemic changes, and this does not sound cardiac related.  Return precautions given.   Patient is hemodynamically stable, in NAD, and able to ambulate in the ED. Evaluation does not show pathology that would require ongoing emergent intervention or inpatient treatment. I explained the diagnosis to the patient. Pain has been managed and has no complaints prior to discharge. Patient is comfortable with above plan and is stable for discharge at this time. All questions were answered prior to disposition. Strict return precautions for returning to the ED were discussed. Encouraged follow up with PCP.   An After Visit Summary was printed and given to the patient.   Portions of this note were generated with Scientist, clinical (histocompatibility and immunogenetics). Dictation errors may occur despite best attempts at proofreading.  Final Clinical Impression(s) / ED Diagnoses Final diagnoses:  Acute gastritis without hemorrhage, unspecified gastritis type    Rx / DC Orders ED Discharge Orders    None       Dietrich Pates, PA-C 08/01/20 2311    HortonClabe Seal, DO 08/02/20 0004

## 2020-08-01 NOTE — Discharge Instructions (Signed)
Your CT did not show any abnormal findings or any explanation of your pain. Please continue your home medications.  Her symptoms could be related to medications but you will benefit from seeing the GI doctor listed below for further work-up such as scoping. Return to the ER if you start to experience worsening abdominal pain or develop a fever, bloody vomiting or stools, chest pain or worsening shortness of breath.

## 2020-08-01 NOTE — ED Triage Notes (Signed)
Abdominal pain x 3 days, vomited x1 3 days ago.  Was sent by her PCP for CT scan of the abdomen.

## 2020-08-22 ENCOUNTER — Ambulatory Visit: Payer: Medicare PPO | Admitting: Cardiology

## 2020-08-28 ENCOUNTER — Encounter: Payer: Self-pay | Admitting: General Practice

## 2020-09-04 DIAGNOSIS — H40013 Open angle with borderline findings, low risk, bilateral: Secondary | ICD-10-CM | POA: Diagnosis not present

## 2020-09-04 DIAGNOSIS — H4720 Unspecified optic atrophy: Secondary | ICD-10-CM | POA: Diagnosis not present

## 2020-09-04 DIAGNOSIS — H2513 Age-related nuclear cataract, bilateral: Secondary | ICD-10-CM | POA: Diagnosis not present

## 2020-10-24 DIAGNOSIS — E039 Hypothyroidism, unspecified: Secondary | ICD-10-CM | POA: Diagnosis not present

## 2020-10-24 DIAGNOSIS — F419 Anxiety disorder, unspecified: Secondary | ICD-10-CM | POA: Diagnosis not present

## 2020-10-27 DIAGNOSIS — F419 Anxiety disorder, unspecified: Secondary | ICD-10-CM | POA: Diagnosis not present

## 2020-10-27 DIAGNOSIS — E039 Hypothyroidism, unspecified: Secondary | ICD-10-CM | POA: Diagnosis not present

## 2020-11-17 DIAGNOSIS — Z6826 Body mass index (BMI) 26.0-26.9, adult: Secondary | ICD-10-CM | POA: Diagnosis not present

## 2020-11-17 DIAGNOSIS — F418 Other specified anxiety disorders: Secondary | ICD-10-CM | POA: Diagnosis not present

## 2020-12-19 DIAGNOSIS — E039 Hypothyroidism, unspecified: Secondary | ICD-10-CM | POA: Diagnosis not present

## 2021-02-03 ENCOUNTER — Other Ambulatory Visit: Payer: Self-pay

## 2021-02-03 ENCOUNTER — Ambulatory Visit
Admission: RE | Admit: 2021-02-03 | Discharge: 2021-02-03 | Disposition: A | Discharge status: Home / Self Care | Payer: Medicare PPO | Source: Ambulatory Visit | Attending: Family Medicine | Admitting: Family Medicine

## 2021-02-03 DIAGNOSIS — Z780 Asymptomatic menopausal state: Secondary | ICD-10-CM | POA: Diagnosis not present

## 2021-02-03 DIAGNOSIS — M85832 Other specified disorders of bone density and structure, left forearm: Secondary | ICD-10-CM | POA: Diagnosis not present

## 2021-02-03 DIAGNOSIS — M810 Age-related osteoporosis without current pathological fracture: Secondary | ICD-10-CM

## 2021-02-03 DIAGNOSIS — Z1231 Encounter for screening mammogram for malignant neoplasm of breast: Secondary | ICD-10-CM

## 2021-02-13 DIAGNOSIS — M810 Age-related osteoporosis without current pathological fracture: Secondary | ICD-10-CM | POA: Diagnosis not present

## 2021-02-20 DIAGNOSIS — Z79899 Other long term (current) drug therapy: Secondary | ICD-10-CM | POA: Diagnosis not present

## 2021-03-06 ENCOUNTER — Other Ambulatory Visit (HOSPITAL_COMMUNITY): Payer: Self-pay | Admitting: *Deleted

## 2021-03-09 ENCOUNTER — Ambulatory Visit (HOSPITAL_COMMUNITY)
Admission: RE | Admit: 2021-03-09 | Discharge: 2021-03-09 | Disposition: A | Discharge status: Home / Self Care | Payer: Medicare PPO | Source: Ambulatory Visit | Attending: Family Medicine | Admitting: Family Medicine

## 2021-03-09 DIAGNOSIS — M810 Age-related osteoporosis without current pathological fracture: Secondary | ICD-10-CM | POA: Diagnosis not present

## 2021-03-09 MED ORDER — ZOLEDRONIC ACID 5 MG/100ML IV SOLN: 5.0000 mg | Freq: Once | INTRAVENOUS | Status: AC

## 2021-03-09 MED ORDER — ZOLEDRONIC ACID 5 MG/100ML IV SOLN
INTRAVENOUS | Status: AC
  Administered 2021-03-09: 5 mg via INTRAVENOUS
  Filled 2021-03-09: qty 100

## 2021-06-29 DIAGNOSIS — E039 Hypothyroidism, unspecified: Secondary | ICD-10-CM | POA: Diagnosis not present

## 2021-06-29 DIAGNOSIS — Z0000 Encounter for general adult medical examination without abnormal findings: Secondary | ICD-10-CM | POA: Diagnosis not present

## 2021-06-29 DIAGNOSIS — F419 Anxiety disorder, unspecified: Secondary | ICD-10-CM | POA: Diagnosis not present

## 2021-06-29 DIAGNOSIS — R931 Abnormal findings on diagnostic imaging of heart and coronary circulation: Secondary | ICD-10-CM | POA: Diagnosis not present

## 2021-06-29 DIAGNOSIS — M810 Age-related osteoporosis without current pathological fracture: Secondary | ICD-10-CM | POA: Diagnosis not present

## 2021-09-09 DIAGNOSIS — H40012 Open angle with borderline findings, low risk, left eye: Secondary | ICD-10-CM | POA: Diagnosis not present

## 2021-09-09 DIAGNOSIS — H4720 Unspecified optic atrophy: Secondary | ICD-10-CM | POA: Diagnosis not present

## 2021-09-09 DIAGNOSIS — H2513 Age-related nuclear cataract, bilateral: Secondary | ICD-10-CM | POA: Diagnosis not present

## 2021-10-28 DIAGNOSIS — L600 Ingrowing nail: Secondary | ICD-10-CM | POA: Diagnosis not present

## 2021-10-28 DIAGNOSIS — M79672 Pain in left foot: Secondary | ICD-10-CM | POA: Diagnosis not present

## 2021-11-11 DIAGNOSIS — T8189XA Other complications of procedures, not elsewhere classified, initial encounter: Secondary | ICD-10-CM | POA: Diagnosis not present

## 2021-12-07 DIAGNOSIS — T8189XD Other complications of procedures, not elsewhere classified, subsequent encounter: Secondary | ICD-10-CM | POA: Diagnosis not present

## 2022-02-16 DIAGNOSIS — M810 Age-related osteoporosis without current pathological fracture: Secondary | ICD-10-CM | POA: Diagnosis not present

## 2022-02-16 DIAGNOSIS — Z6826 Body mass index (BMI) 26.0-26.9, adult: Secondary | ICD-10-CM | POA: Diagnosis not present

## 2022-02-16 DIAGNOSIS — Z79899 Other long term (current) drug therapy: Secondary | ICD-10-CM | POA: Diagnosis not present

## 2022-02-16 DIAGNOSIS — F419 Anxiety disorder, unspecified: Secondary | ICD-10-CM | POA: Diagnosis not present

## 2022-02-16 DIAGNOSIS — E559 Vitamin D deficiency, unspecified: Secondary | ICD-10-CM | POA: Diagnosis not present

## 2022-02-16 DIAGNOSIS — E039 Hypothyroidism, unspecified: Secondary | ICD-10-CM | POA: Diagnosis not present

## 2022-02-16 DIAGNOSIS — R931 Abnormal findings on diagnostic imaging of heart and coronary circulation: Secondary | ICD-10-CM | POA: Diagnosis not present

## 2022-02-16 DIAGNOSIS — Z23 Encounter for immunization: Secondary | ICD-10-CM | POA: Diagnosis not present

## 2022-02-25 ENCOUNTER — Encounter (HOSPITAL_COMMUNITY): Payer: Medicare PPO

## 2022-03-09 ENCOUNTER — Other Ambulatory Visit (HOSPITAL_COMMUNITY): Payer: Self-pay

## 2022-03-10 ENCOUNTER — Ambulatory Visit (HOSPITAL_COMMUNITY)
Admission: RE | Admit: 2022-03-10 | Discharge: 2022-03-10 | Disposition: A | Discharge status: Home / Self Care | Payer: Medicare PPO | Source: Ambulatory Visit | Attending: Family Medicine | Admitting: Family Medicine

## 2022-03-10 DIAGNOSIS — M810 Age-related osteoporosis without current pathological fracture: Secondary | ICD-10-CM | POA: Insufficient documentation

## 2022-03-10 MED ORDER — ZOLEDRONIC ACID 5 MG/100ML IV SOLN
INTRAVENOUS | Status: AC
  Administered 2022-03-10: 5 mg via INTRAVENOUS
  Filled 2022-03-10: qty 100

## 2022-03-10 MED ORDER — ZOLEDRONIC ACID 5 MG/100ML IV SOLN: 5.0000 mg | Freq: Once | INTRAVENOUS | Status: AC

## 2022-05-04 ENCOUNTER — Other Ambulatory Visit: Payer: Self-pay | Admitting: Family Medicine

## 2022-05-04 DIAGNOSIS — Z1231 Encounter for screening mammogram for malignant neoplasm of breast: Secondary | ICD-10-CM

## 2022-06-22 ENCOUNTER — Ambulatory Visit
Admission: RE | Admit: 2022-06-22 | Discharge: 2022-06-22 | Disposition: A | Discharge status: Home / Self Care | Payer: Medicare PPO | Source: Ambulatory Visit | Attending: Family Medicine | Admitting: Family Medicine

## 2022-06-22 DIAGNOSIS — Z1231 Encounter for screening mammogram for malignant neoplasm of breast: Secondary | ICD-10-CM | POA: Diagnosis not present

## 2022-07-05 DIAGNOSIS — Z6827 Body mass index (BMI) 27.0-27.9, adult: Secondary | ICD-10-CM | POA: Diagnosis not present

## 2022-07-05 DIAGNOSIS — Z0000 Encounter for general adult medical examination without abnormal findings: Secondary | ICD-10-CM | POA: Diagnosis not present

## 2022-07-05 DIAGNOSIS — Z1389 Encounter for screening for other disorder: Secondary | ICD-10-CM | POA: Diagnosis not present

## 2022-09-15 DIAGNOSIS — H2513 Age-related nuclear cataract, bilateral: Secondary | ICD-10-CM | POA: Diagnosis not present

## 2022-09-15 DIAGNOSIS — H4720 Unspecified optic atrophy: Secondary | ICD-10-CM | POA: Diagnosis not present

## 2022-09-15 DIAGNOSIS — H40012 Open angle with borderline findings, low risk, left eye: Secondary | ICD-10-CM | POA: Diagnosis not present

## 2022-10-07 DIAGNOSIS — D485 Neoplasm of uncertain behavior of skin: Secondary | ICD-10-CM | POA: Diagnosis not present

## 2022-10-07 DIAGNOSIS — L820 Inflamed seborrheic keratosis: Secondary | ICD-10-CM | POA: Diagnosis not present

## 2022-10-07 DIAGNOSIS — L821 Other seborrheic keratosis: Secondary | ICD-10-CM | POA: Diagnosis not present

## 2022-10-07 DIAGNOSIS — C44319 Basal cell carcinoma of skin of other parts of face: Secondary | ICD-10-CM | POA: Diagnosis not present

## 2022-10-26 DIAGNOSIS — H2512 Age-related nuclear cataract, left eye: Secondary | ICD-10-CM | POA: Diagnosis not present

## 2022-11-15 DIAGNOSIS — C44319 Basal cell carcinoma of skin of other parts of face: Secondary | ICD-10-CM | POA: Diagnosis not present

## 2022-12-31 DIAGNOSIS — L821 Other seborrheic keratosis: Secondary | ICD-10-CM | POA: Diagnosis not present

## 2022-12-31 DIAGNOSIS — L820 Inflamed seborrheic keratosis: Secondary | ICD-10-CM | POA: Diagnosis not present

## 2023-03-02 DIAGNOSIS — I700 Atherosclerosis of aorta: Secondary | ICD-10-CM | POA: Diagnosis not present

## 2023-03-02 DIAGNOSIS — R931 Abnormal findings on diagnostic imaging of heart and coronary circulation: Secondary | ICD-10-CM | POA: Diagnosis not present

## 2023-03-02 DIAGNOSIS — F419 Anxiety disorder, unspecified: Secondary | ICD-10-CM | POA: Diagnosis not present

## 2023-03-02 DIAGNOSIS — M810 Age-related osteoporosis without current pathological fracture: Secondary | ICD-10-CM | POA: Diagnosis not present

## 2023-03-02 DIAGNOSIS — E039 Hypothyroidism, unspecified: Secondary | ICD-10-CM | POA: Diagnosis not present

## 2023-03-04 ENCOUNTER — Other Ambulatory Visit: Payer: Self-pay | Admitting: Family Medicine

## 2023-03-04 DIAGNOSIS — M810 Age-related osteoporosis without current pathological fracture: Secondary | ICD-10-CM

## 2023-03-15 ENCOUNTER — Other Ambulatory Visit (HOSPITAL_COMMUNITY): Payer: Self-pay | Admitting: *Deleted

## 2023-03-16 ENCOUNTER — Ambulatory Visit (HOSPITAL_COMMUNITY)
Admission: RE | Admit: 2023-03-16 | Discharge: 2023-03-16 | Disposition: A | Discharge status: Home / Self Care | Payer: Medicare PPO | Source: Ambulatory Visit | Attending: Family Medicine | Admitting: Family Medicine

## 2023-03-16 DIAGNOSIS — M810 Age-related osteoporosis without current pathological fracture: Secondary | ICD-10-CM | POA: Diagnosis not present

## 2023-03-16 MED ORDER — ZOLEDRONIC ACID 5 MG/100ML IV SOLN
INTRAVENOUS | Status: AC
  Administered 2023-03-16: 5 mg via INTRAVENOUS
  Filled 2023-03-16: qty 100

## 2023-03-16 MED ORDER — ZOLEDRONIC ACID 5 MG/100ML IV SOLN: 5.0000 mg | Freq: Once | INTRAVENOUS | Status: AC

## 2023-05-27 ENCOUNTER — Other Ambulatory Visit: Payer: Self-pay | Admitting: Family Medicine

## 2023-05-27 DIAGNOSIS — Z1231 Encounter for screening mammogram for malignant neoplasm of breast: Secondary | ICD-10-CM

## 2023-06-07 DIAGNOSIS — Z961 Presence of intraocular lens: Secondary | ICD-10-CM | POA: Diagnosis not present

## 2023-06-07 DIAGNOSIS — H40012 Open angle with borderline findings, low risk, left eye: Secondary | ICD-10-CM | POA: Diagnosis not present

## 2023-06-07 DIAGNOSIS — H2511 Age-related nuclear cataract, right eye: Secondary | ICD-10-CM | POA: Diagnosis not present

## 2023-06-07 DIAGNOSIS — H4720 Unspecified optic atrophy: Secondary | ICD-10-CM | POA: Diagnosis not present

## 2023-06-29 ENCOUNTER — Ambulatory Visit
Admission: RE | Admit: 2023-06-29 | Discharge: 2023-06-29 | Disposition: A | Discharge status: Home / Self Care | Payer: Medicare PPO | Source: Ambulatory Visit | Attending: Family Medicine | Admitting: Family Medicine

## 2023-06-29 DIAGNOSIS — Z1231 Encounter for screening mammogram for malignant neoplasm of breast: Secondary | ICD-10-CM | POA: Diagnosis not present

## 2023-07-04 ENCOUNTER — Other Ambulatory Visit (HOSPITAL_COMMUNITY): Payer: Self-pay | Admitting: Sports Medicine

## 2023-07-04 ENCOUNTER — Encounter (HOSPITAL_COMMUNITY): Payer: Self-pay

## 2023-07-04 ENCOUNTER — Ambulatory Visit (HOSPITAL_COMMUNITY)
Admission: RE | Admit: 2023-07-04 | Discharge: 2023-07-04 | Disposition: A | Discharge status: Home / Self Care | Payer: Medicare PPO | Source: Ambulatory Visit | Attending: Cardiovascular Disease | Admitting: Cardiovascular Disease

## 2023-07-04 DIAGNOSIS — M79604 Pain in right leg: Secondary | ICD-10-CM | POA: Insufficient documentation

## 2023-07-04 DIAGNOSIS — M1711 Unilateral primary osteoarthritis, right knee: Secondary | ICD-10-CM | POA: Diagnosis not present

## 2023-07-04 DIAGNOSIS — M79661 Pain in right lower leg: Secondary | ICD-10-CM | POA: Diagnosis not present

## 2023-07-04 DIAGNOSIS — M545 Low back pain, unspecified: Secondary | ICD-10-CM | POA: Diagnosis not present

## 2023-07-26 DIAGNOSIS — M25561 Pain in right knee: Secondary | ICD-10-CM | POA: Diagnosis not present

## 2023-08-05 DIAGNOSIS — S82141A Displaced bicondylar fracture of right tibia, initial encounter for closed fracture: Secondary | ICD-10-CM | POA: Diagnosis not present

## 2023-09-05 DIAGNOSIS — M1711 Unilateral primary osteoarthritis, right knee: Secondary | ICD-10-CM | POA: Diagnosis not present

## 2023-09-08 ENCOUNTER — Other Ambulatory Visit: Payer: Medicare PPO

## 2023-09-09 ENCOUNTER — Other Ambulatory Visit: Payer: Self-pay | Admitting: Family Medicine

## 2023-09-09 DIAGNOSIS — M810 Age-related osteoporosis without current pathological fracture: Secondary | ICD-10-CM

## 2023-11-14 DIAGNOSIS — S20212A Contusion of left front wall of thorax, initial encounter: Secondary | ICD-10-CM | POA: Diagnosis not present

## 2023-11-14 DIAGNOSIS — M25552 Pain in left hip: Secondary | ICD-10-CM | POA: Diagnosis not present

## 2023-11-14 DIAGNOSIS — R0781 Pleurodynia: Secondary | ICD-10-CM | POA: Diagnosis not present

## 2024-03-01 DIAGNOSIS — M810 Age-related osteoporosis without current pathological fracture: Secondary | ICD-10-CM | POA: Diagnosis not present

## 2024-03-01 DIAGNOSIS — F419 Anxiety disorder, unspecified: Secondary | ICD-10-CM | POA: Diagnosis not present

## 2024-03-01 DIAGNOSIS — R931 Abnormal findings on diagnostic imaging of heart and coronary circulation: Secondary | ICD-10-CM | POA: Diagnosis not present

## 2024-03-01 DIAGNOSIS — Z23 Encounter for immunization: Secondary | ICD-10-CM | POA: Diagnosis not present

## 2024-03-01 DIAGNOSIS — E039 Hypothyroidism, unspecified: Secondary | ICD-10-CM | POA: Diagnosis not present

## 2024-03-01 DIAGNOSIS — Z6826 Body mass index (BMI) 26.0-26.9, adult: Secondary | ICD-10-CM | POA: Diagnosis not present

## 2024-03-05 ENCOUNTER — Telehealth (HOSPITAL_COMMUNITY): Payer: Self-pay

## 2024-03-05 NOTE — Telephone Encounter (Signed)
 Auth Submission: NO AUTH NEEDED Site of care: Site of care: [facility] Payer: [employer] Medication & CPT/J Code(s) submitted: Reclast  (Zolendronic acid) J3489 Diagnosis Code: M81.0 Route of submission (phone, fax, portal):  Phone # Fax # Auth type: Buy/Bill HB Units/visits requested: 5mg  x 1 dose Reference number:  Approval from: 03/05/24 to 07/18/24

## 2024-03-15 ENCOUNTER — Other Ambulatory Visit (HOSPITAL_COMMUNITY): Payer: Self-pay

## 2024-03-16 ENCOUNTER — Ambulatory Visit (HOSPITAL_COMMUNITY)
Admission: RE | Admit: 2024-03-16 | Discharge: 2024-03-16 | Disposition: A | Discharge status: Home / Self Care | Source: Ambulatory Visit | Attending: Family Medicine | Admitting: Family Medicine

## 2024-03-16 DIAGNOSIS — M810 Age-related osteoporosis without current pathological fracture: Secondary | ICD-10-CM | POA: Insufficient documentation

## 2024-03-16 MED ORDER — ZOLEDRONIC ACID 5 MG/100ML IV SOLN
5.0000 mg | Freq: Once | INTRAVENOUS | Status: AC
  Administered 2024-03-16: 5 mg via INTRAVENOUS

## 2024-03-16 MED ORDER — ZOLEDRONIC ACID 5 MG/100ML IV SOLN
INTRAVENOUS | Status: AC
  Filled 2024-03-16: qty 100

## 2024-05-07 ENCOUNTER — Other Ambulatory Visit: Payer: Medicare PPO

## 2024-05-08 ENCOUNTER — Ambulatory Visit (HOSPITAL_BASED_OUTPATIENT_CLINIC_OR_DEPARTMENT_OTHER)
Admission: RE | Admit: 2024-05-08 | Discharge: 2024-05-08 | Disposition: A | Discharge status: Home / Self Care | Source: Ambulatory Visit | Attending: Family Medicine | Admitting: Family Medicine

## 2024-05-08 DIAGNOSIS — Z780 Asymptomatic menopausal state: Secondary | ICD-10-CM | POA: Diagnosis not present

## 2024-05-08 DIAGNOSIS — M810 Age-related osteoporosis without current pathological fracture: Secondary | ICD-10-CM | POA: Insufficient documentation

## 2024-06-19 DIAGNOSIS — H2511 Age-related nuclear cataract, right eye: Secondary | ICD-10-CM | POA: Diagnosis not present

## 2024-06-19 DIAGNOSIS — Z961 Presence of intraocular lens: Secondary | ICD-10-CM | POA: Diagnosis not present

## 2024-06-19 DIAGNOSIS — H40012 Open angle with borderline findings, low risk, left eye: Secondary | ICD-10-CM | POA: Diagnosis not present

## 2024-06-19 DIAGNOSIS — H4720 Unspecified optic atrophy: Secondary | ICD-10-CM | POA: Diagnosis not present
